# Patient Record
Sex: Male | Born: 1960 | Race: White | Hispanic: No | Marital: Single | Smoking: Never smoker
Health system: Southern US, Community
[De-identification: ages and names within clinical notes are randomized; demographics above are authoritative.]

## PROBLEM LIST (undated history)

## (undated) DIAGNOSIS — F419 Anxiety disorder, unspecified: Secondary | ICD-10-CM

## (undated) DIAGNOSIS — M549 Dorsalgia, unspecified: Secondary | ICD-10-CM

## (undated) DIAGNOSIS — M1990 Unspecified osteoarthritis, unspecified site: Secondary | ICD-10-CM

## (undated) DIAGNOSIS — F32A Depression, unspecified: Secondary | ICD-10-CM

## (undated) DIAGNOSIS — E785 Hyperlipidemia, unspecified: Secondary | ICD-10-CM

## (undated) DIAGNOSIS — K219 Gastro-esophageal reflux disease without esophagitis: Secondary | ICD-10-CM

## (undated) DIAGNOSIS — I10 Essential (primary) hypertension: Secondary | ICD-10-CM

## (undated) DIAGNOSIS — F329 Major depressive disorder, single episode, unspecified: Secondary | ICD-10-CM

## (undated) DIAGNOSIS — I2510 Atherosclerotic heart disease of native coronary artery without angina pectoris: Secondary | ICD-10-CM

## (undated) DIAGNOSIS — J449 Chronic obstructive pulmonary disease, unspecified: Secondary | ICD-10-CM

## (undated) HISTORY — DX: Hyperlipidemia, unspecified: E78.5

## (undated) HISTORY — PX: NO PAST SURGERIES: SHX2092

## (undated) HISTORY — DX: Depression, unspecified: F32.A

## (undated) HISTORY — PX: WISDOM TOOTH EXTRACTION: SHX21

## (undated) HISTORY — DX: Major depressive disorder, single episode, unspecified: F32.9

---

## 2010-08-17 ENCOUNTER — Emergency Department (HOSPITAL_COMMUNITY)
Admission: EM | Admit: 2010-08-17 | Discharge: 2010-08-17 | Payer: Self-pay | Source: Home / Self Care | Admitting: Emergency Medicine

## 2010-11-06 LAB — DIFFERENTIAL
Eosinophils Relative: 5 % (ref 0–5)
Lymphocytes Relative: 48 % — ABNORMAL HIGH (ref 12–46)
Neutro Abs: 3.2 10*3/uL (ref 1.7–7.7)
Neutrophils Relative %: 40 % — ABNORMAL LOW (ref 43–77)

## 2010-11-06 LAB — BASIC METABOLIC PANEL
CO2: 25 mEq/L (ref 19–32)
Calcium: 9.2 mg/dL (ref 8.4–10.5)
Creatinine, Ser: 0.95 mg/dL (ref 0.4–1.5)
GFR calc non Af Amer: >60 mL/min (ref >60)
Glucose, Bld: 120 mg/dL — ABNORMAL HIGH (ref 70–99)
Sodium: 139 mEq/L (ref 135–145)

## 2010-11-06 LAB — CBC
MCV: 86.2 fL (ref 78.0–100.0)
Platelets: 287 10*3/uL (ref 150–400)

## 2010-11-25 ENCOUNTER — Emergency Department (HOSPITAL_COMMUNITY): Payer: Medicaid Other

## 2010-11-25 ENCOUNTER — Emergency Department (HOSPITAL_COMMUNITY)
Admission: EM | Admit: 2010-11-25 | Discharge: 2010-11-26 | Disposition: A | Discharge status: Home / Self Care | Payer: Medicaid Other | Attending: Emergency Medicine | Admitting: Emergency Medicine

## 2010-11-25 DIAGNOSIS — I10 Essential (primary) hypertension: Secondary | ICD-10-CM | POA: Insufficient documentation

## 2010-11-25 DIAGNOSIS — W260XXA Contact with knife, initial encounter: Secondary | ICD-10-CM | POA: Insufficient documentation

## 2010-11-25 DIAGNOSIS — W261XXA Contact with sword or dagger, initial encounter: Secondary | ICD-10-CM | POA: Insufficient documentation

## 2010-11-25 DIAGNOSIS — S61209A Unspecified open wound of unspecified finger without damage to nail, initial encounter: Secondary | ICD-10-CM | POA: Insufficient documentation

## 2012-03-21 ENCOUNTER — Encounter (HOSPITAL_COMMUNITY): Payer: Self-pay | Admitting: Emergency Medicine

## 2012-03-21 ENCOUNTER — Emergency Department (HOSPITAL_COMMUNITY)
Admission: EM | Admit: 2012-03-21 | Discharge: 2012-03-22 | Disposition: A | Discharge status: Home / Self Care | Payer: Medicaid Other | Attending: Emergency Medicine | Admitting: Emergency Medicine

## 2012-03-21 DIAGNOSIS — I10 Essential (primary) hypertension: Secondary | ICD-10-CM | POA: Insufficient documentation

## 2012-03-21 DIAGNOSIS — Z8249 Family history of ischemic heart disease and other diseases of the circulatory system: Secondary | ICD-10-CM | POA: Insufficient documentation

## 2012-03-21 DIAGNOSIS — Z809 Family history of malignant neoplasm, unspecified: Secondary | ICD-10-CM | POA: Insufficient documentation

## 2012-03-21 DIAGNOSIS — R0789 Other chest pain: Secondary | ICD-10-CM | POA: Insufficient documentation

## 2012-03-21 DIAGNOSIS — I2510 Atherosclerotic heart disease of native coronary artery without angina pectoris: Secondary | ICD-10-CM | POA: Insufficient documentation

## 2012-03-21 DIAGNOSIS — Z833 Family history of diabetes mellitus: Secondary | ICD-10-CM | POA: Insufficient documentation

## 2012-03-21 HISTORY — DX: Essential (primary) hypertension: I10

## 2012-03-21 HISTORY — DX: Dorsalgia, unspecified: M54.9

## 2012-03-21 HISTORY — DX: Atherosclerotic heart disease of native coronary artery without angina pectoris: I25.10

## 2012-03-21 NOTE — ED Notes (Signed)
Pt states he was having flu like sxs that started a few days ago and his dr called in a zpack yesterday  Pt states he took the second dose today

## 2012-03-21 NOTE — ED Notes (Signed)
Pt states he has chest pain in his left chest that radiates up into his left shoulder  Pt states it started 3 days ago with pain in his right shoulder and neck  Pt states the pain is a squeezing pressure  Pt states he continues to have the pain in his shoulder and is also having some pain in his right lower leg as well   Pt denies any other sxs but states he has been having "weird sensations in my body" over the past couple of days

## 2012-03-22 ENCOUNTER — Emergency Department (HOSPITAL_COMMUNITY): Payer: Medicaid Other

## 2012-03-22 LAB — BASIC METABOLIC PANEL
CO2: 25 mEq/L (ref 19–32)
Chloride: 102 mEq/L (ref 96–112)
GFR calc Af Amer: >90 mL/min (ref >90)
GFR calc non Af Amer: >90 mL/min (ref >90)
Glucose, Bld: 102 mg/dL — ABNORMAL HIGH (ref 70–99)
Sodium: 138 mEq/L (ref 135–145)

## 2012-03-22 LAB — CBC WITH DIFFERENTIAL/PLATELET
Basophils Absolute: 0 10*3/uL (ref 0.0–0.1)
Eosinophils Relative: 6 % — ABNORMAL HIGH (ref 0–5)
Hemoglobin: 14.2 g/dL (ref 13.0–17.0)
Lymphs Abs: 3.1 10*3/uL (ref 0.7–4.0)
MCH: 29.2 pg (ref 26.0–34.0)
MCV: 84.4 fL (ref 78.0–100.0)
Platelets: 275 10*3/uL (ref 150–400)
RBC: 4.86 MIL/uL (ref 4.22–5.81)
RDW: 13.3 % (ref 11.5–15.5)

## 2012-03-22 NOTE — ED Notes (Signed)
NP at bedside.

## 2012-03-22 NOTE — ED Provider Notes (Signed)
History     CSN: [id number]  Arrival date & time 03/21/12  2239   First MD Initiated Contact with Patient 03/22/12 [phone]      Chief Complaint  Patient presents with  . Chest Pain    (Consider location/radiation/quality/duration/timing/severity/associated sxs/prior treatment) HPI Comments: Patient states, that for the past week or so.  He's been having and having intermittent episodes of chest pain, radiating to his arm.  He states he has a history of angina, and several years ago, took nitroglycerin, but didn't need it for several years, so his physician.  Has not renewed at.  He, states, that this pain has woken him up.  The last 2 nights.  It is associated with shortness of breath and nausea he also has periods of their complaints of myalgias in his lower leg.  The right side of his neck, radiating to his right jaw.  He, states that 3 days ago.  He called his primary care physician because he had "flulike symptoms" which included congestion, vomiting, and diarrhea, and was called in a Z-Pak.  He is taken 2 days worth of azithromycin.  The history is provided by the patient.    Past Medical History  Diagnosis Date  . Hypertension   . CAD (coronary artery disease)   . Back pain     History reviewed. No pertinent past surgical history.  Family History  Problem Relation Age of Onset  . Diabetes Other   . Cancer Other   . Hypertension Other     History  Substance Use Topics  . Smoking status: Never Smoker   . Smokeless tobacco: Not on file  . Alcohol Use: Yes     rare      Review of Systems  Constitutional: Negative for fever and chills.  Respiratory: Positive for shortness of breath.   Cardiovascular: Positive for chest pain. Negative for palpitations and leg swelling.  Gastrointestinal: Positive for nausea. Negative for vomiting.    Allergies  Review of patient's allergies indicates no known allergies.  Home Medications   Current Outpatient Rx  Name Route Sig  Dispense Refill  . AMLODIPINE BESYLATE 10 MG PO TABS Oral Take 10 mg by mouth daily.    . ATENOLOL 50 MG PO TABS Oral Take 50 mg by mouth 2 (two) times daily.    . AZITHROMYCIN 250 MG PO TABS Oral Take 250-500 mg by mouth daily. Zpack: Take 2 tablets the first day then take 1 tablet for 4 days. Stop date: 03/24/12    . CLORAZEPATE DIPOTASSIUM 3.75 MG PO TABS Oral Take 3.75 mg by mouth 2 (two) times daily.    Marland Kitchen LISINOPRIL 20 MG PO TABS Oral Take 20 mg by mouth daily.    Marland Kitchen SIMVASTATIN 20 MG PO TABS Oral Take 20 mg by mouth every evening.    Marland Kitchen TRAMADOL HCL 50 MG PO TABS Oral Take 50 mg by mouth 2 (two) times daily as needed.      BP 126/88  Pulse 60  Temp 97.9 F (36.6 C) (Oral)  Resp 12  SpO2 98%  Physical Exam  Constitutional: He is oriented to person, place, and time. He appears well-developed.  HENT:  Head: Normocephalic.  Eyes: Pupils are equal, round, and reactive to light.  Neck: Normal range of motion.  Cardiovascular: Normal rate and regular rhythm.   No murmur heard. Pulmonary/Chest: Effort normal. He exhibits no tenderness.  Abdominal: Soft.  Musculoskeletal: Normal range of motion.  Neurological: He is alert and oriented  to person, place, and time.  Skin: Skin is warm. No rash noted.    ED Course  Procedures (including critical care time)  Labs Reviewed  CBC WITH DIFFERENTIAL - Abnormal; Notable for the following:    Neutrophils Relative 37 (*)     Lymphocytes Relative 48 (*)     Eosinophils Relative 6 (*)     All other components within normal limits  BASIC METABOLIC PANEL - Abnormal; Notable for the following:    Glucose, Bld 102 (*)     All other components within normal limits  TROPONIN I   Dg Chest 2 View  03/22/2012  *RADIOLOGY REPORT*  Clinical Data: Chest pain, shortness of breath. Hypertension.  CHEST - 2 VIEW  Comparison: 08/17/2010  Findings: Right mediastinal prominence is favored to reflect mild aortic tortuosity.  Heart size within normal range.   No focal consolidation.  No pleural effusion or pneumothorax. Multilevel degenerative changes.  No acute osseous finding.  IMPRESSION: Right mediastinal fullness is likely secondary to mild aortic tortuosity and can be confirmed with a chest CT follow-up.  Original Report Authenticated By: Waneta Martins, M.D.     1. Chest pain, atypical    ED ECG REPORT   Date: 03/22/2012  EKG Time: 5:36 AM  Rate: 73  Rhythm: normal sinus rhythm,  there are no previous tracings available for comparison  Axis: normal  Intervals:none  ST&T Change: none  Narrative Interpretation: none             MDM   Suspicions been having chest pain, on and off for the past week, plus I will obtain one set of cardiac markers, EKG, chest x-ray, electrolytes, to evaluate his chest pain Patient has had no further episodes of chest pain.  While in the emergency department.  I reviewed his lab results EKG, and chest x-ray, with him, and we'll refer him to cardiology for an outpatient stress test and back to his primary care provider for continued medical treatment      Arman Filter, NP 03/22/12 [phone]

## 2012-03-22 NOTE — ED Provider Notes (Signed)
Medical screening examination/treatment/procedure(s) were performed by non-physician practitioner and as supervising physician I was immediately available for consultation/collaboration. Alithea Lapage, MD, FACEP   Dianelly Ferran L Chloe Bluett, MD 03/22/12 0546 

## 2012-03-22 NOTE — ED Notes (Signed)
Patient transported to X-ray 

## 2012-03-22 NOTE — ED Notes (Signed)
Pt reports taking 3 "baby" aspirin on the way to the ED.

## 2015-04-28 ENCOUNTER — Emergency Department (HOSPITAL_COMMUNITY)
Admission: EM | Admit: 2015-04-28 | Discharge: 2015-04-28 | Disposition: A | Discharge status: Home / Self Care | Payer: Medicaid Other | Attending: Emergency Medicine | Admitting: Emergency Medicine

## 2015-04-28 ENCOUNTER — Encounter (HOSPITAL_COMMUNITY): Payer: Self-pay | Admitting: Emergency Medicine

## 2015-04-28 ENCOUNTER — Emergency Department (HOSPITAL_COMMUNITY): Payer: Medicaid Other

## 2015-04-28 DIAGNOSIS — Z79899 Other long term (current) drug therapy: Secondary | ICD-10-CM | POA: Diagnosis not present

## 2015-04-28 DIAGNOSIS — N189 Chronic kidney disease, unspecified: Secondary | ICD-10-CM | POA: Insufficient documentation

## 2015-04-28 DIAGNOSIS — R079 Chest pain, unspecified: Secondary | ICD-10-CM

## 2015-04-28 DIAGNOSIS — I129 Hypertensive chronic kidney disease with stage 1 through stage 4 chronic kidney disease, or unspecified chronic kidney disease: Secondary | ICD-10-CM | POA: Insufficient documentation

## 2015-04-28 LAB — CBC
HCT: 43.2 % (ref 39.0–52.0)
Hemoglobin: 14.8 g/dL (ref 13.0–17.0)
MCH: 29.2 pg (ref 26.0–34.0)
MCHC: 34.3 g/dL (ref 30.0–36.0)
MCV: 85.2 fL (ref 78.0–100.0)
PLATELETS: 282 10*3/uL (ref 150–400)
RBC: 5.07 MIL/uL (ref 4.22–5.81)
RDW: 13.1 % (ref 11.5–15.5)
WBC: 4.9 10*3/uL (ref 4.0–10.5)

## 2015-04-28 LAB — COMPREHENSIVE METABOLIC PANEL
ALT: 17 U/L (ref 17–63)
AST: 20 U/L (ref 15–41)
Albumin: 4.4 g/dL (ref 3.5–5.0)
Alkaline Phosphatase: 61 U/L (ref 38–126)
Anion gap: 9 (ref 5–15)
BUN: 11 mg/dL (ref 6–20)
CHLORIDE: 107 mmol/L (ref 101–111)
CO2: 23 mmol/L (ref 22–32)
Calcium: 9.5 mg/dL (ref 8.9–10.3)
Creatinine, Ser: 1.05 mg/dL (ref 0.61–1.24)
Glucose, Bld: 106 mg/dL — ABNORMAL HIGH (ref 65–99)
POTASSIUM: 4 mmol/L (ref 3.5–5.1)
SODIUM: 139 mmol/L (ref 135–145)
Total Bilirubin: 0.5 mg/dL (ref 0.3–1.2)
Total Protein: 8.1 g/dL (ref 6.5–8.1)

## 2015-04-28 LAB — I-STAT TROPONIN, ED
TROPONIN I, POC: 0 ng/mL (ref 0.00–0.08)
TROPONIN I, POC: 0.01 ng/mL (ref 0.00–0.08)

## 2015-04-28 LAB — PROTIME-INR
INR: 0.95 (ref 0.00–1.49)
PROTHROMBIN TIME: 12.9 s (ref 11.6–15.2)

## 2015-04-28 LAB — APTT: APTT: 25 s (ref 24–37)

## 2015-04-28 MED ORDER — ASPIRIN 81 MG PO CHEW: 324.0000 mg | CHEWABLE_TABLET | Freq: Once | ORAL | Status: DC

## 2015-04-28 MED ORDER — SODIUM CHLORIDE 0.9 % IV SOLN
20.0000 mL | INTRAVENOUS | Status: DC
  Administered 2015-04-28: 20 mL via INTRAVENOUS

## 2015-04-28 MED ORDER — NITROGLYCERIN 0.4 MG SL SUBL: 0.4000 mg | SUBLINGUAL_TABLET | SUBLINGUAL | Status: DC | PRN

## 2015-04-28 NOTE — Discharge Instructions (Signed)

## 2015-04-28 NOTE — ED Provider Notes (Signed)
CSN: [id number]     Arrival date & time 04/28/15  1103 History   First MD Initiated Contact with Patient 04/28/15 1116     Chief Complaint  Patient presents with  . Chest Pain     (Consider location/radiation/quality/duration/timing/severity/associated sxs/prior Treatment) HPI 54 year old male comes in to complaining of intermittent sharp chest pain that began 2 weeks ago. He cannot identify any precipitating factors. It is momentary and resolve spontaneously. He had some pain this morning. It lasted longer than usual and came to the emergency department. Upon arrival here it resolves spontaneously. He did take aspirin this morning. Past Medical History  Diagnosis Date  . Hypertension   . CAD (coronary artery disease)   . Back pain    History reviewed. No pertinent past surgical history. Family History  Problem Relation Age of Onset  . Diabetes Other   . Cancer Other   . Hypertension Other    Social History  Substance Use Topics  . Smoking status: Never Smoker   . Smokeless tobacco: None  . Alcohol Use: Yes     Comment: rare    Review of Systems  All other systems reviewed and are negative.     Allergies  Review of patient's allergies indicates no known allergies.  Home Medications   Prior to Admission medications   Medication Sig Start Date End Date Taking? Authorizing Provider  amLODipine (NORVASC) 10 MG tablet Take 10 mg by mouth daily.   Yes Historical Provider, MD  atenolol (TENORMIN) 50 MG tablet Take 50 mg by mouth 2 (two) times daily.   Yes Historical Provider, MD  busPIRone (BUSPAR) 10 MG tablet Take 10 mg by mouth 2 (two) times daily. 04/01/15  Yes Historical Provider, MD  CIALIS 5 MG tablet Take 5 mg by mouth daily as needed for erectile dysfunction.  04/24/15  Yes Historical Provider, MD  HYDROcodone-acetaminophen (NORCO/VICODIN) 5-325 MG per tablet Take 1 tablet by mouth 2 (two) times daily as needed for moderate pain or severe pain.   Yes Historical  Provider, MD  lisinopril (PRINIVIL,ZESTRIL) 20 MG tablet Take 20 mg by mouth daily.   Yes Historical Provider, MD  LORazepam (ATIVAN) 1 MG tablet Take 1 mg by mouth 2 (two) times daily. 04/01/15  Yes Historical Provider, MD   BP 120/81 mmHg  Pulse 65  Temp(Src) 98.5 F (36.9 C) (Oral)  Resp 20  SpO2 100% Physical Exam  Constitutional: He is oriented to person, place, and time. He appears well-developed and well-nourished.  HENT:  Head: Normocephalic and atraumatic.  Right Ear: External ear normal.  Left Ear: External ear normal.  Nose: Nose normal.  Mouth/Throat: Oropharynx is clear and moist.  Eyes: Conjunctivae and EOM are normal. Pupils are equal, round, and reactive to light.  Neck: Normal range of motion. Neck supple.  Cardiovascular: Normal rate, regular rhythm, normal heart sounds and intact distal pulses.   Pulmonary/Chest: Effort normal and breath sounds normal. No respiratory distress. He has no wheezes. He exhibits no tenderness.  Abdominal: Soft. Bowel sounds are normal. He exhibits no distension and no mass. There is no tenderness. There is no guarding.  Musculoskeletal: Normal range of motion.  Neurological: He is alert and oriented to person, place, and time. He has normal reflexes. He exhibits normal muscle tone. Coordination normal.  Skin: Skin is warm and dry.  Psychiatric: He has a normal mood and affect. His behavior is normal. Judgment and thought content normal.  Nursing note and vitals reviewed.   ED Course  Procedures (including critical care time) Labs Review Labs Reviewed  COMPREHENSIVE METABOLIC PANEL - Abnormal; Notable for the following:    Glucose, Bld 106 (*)    All other components within normal limits  CBC  APTT  PROTIME-INR  I-STAT TROPOININ, ED  Rosezena Sensor, ED    Imaging Review Dg Chest 2 View  04/28/2015   CLINICAL DATA:  Mid left chest pain  EXAM: CHEST  2 VIEW  COMPARISON:  03/22/2012  FINDINGS: Normal heart size and mediastinal  contours. No acute infiltrate or edema. No effusion or pneumothorax. Prominent thoracic spondylosis. No acute osseous findings.  IMPRESSION: No active cardiopulmonary disease.   Electronically Signed   By: Marnee Spring M.D.   On: 04/28/2015 11:36   I have personally reviewed and evaluated these images and lab results as part of my medical decision-making.   EKG Interpretation   Date/Time:  Thursday April 28 2015 11:12:29 EDT Ventricular Rate:  70 PR Interval:  161 QRS Duration: 95 QT Interval:  389 QTC Calculation: 420 R Axis:   70 Text Interpretation:  Normal sinus rhythm Non-specific ST-t changes lead  II and avf Confirmed by Eliott Amparan MD, Lenisha Lacap ([id number]) on 04/28/2015 11:16:57 AM      MDM   Final diagnoses:  Chest pain, unspecified chest pain type    Patient remained pain-free here in emergency department. EKG shows no evidence of acute ischemic changes. Troponin is normal 2. Patient advised regarding need for close follow-up   Margarita Grizzle, MD 04/28/15 [phone]

## 2015-04-28 NOTE — ED Notes (Signed)
Per MD, hold nitro at this time d/t no chest pain.

## 2015-04-28 NOTE — ED Notes (Signed)
Spoke to MD about patient stating he could feel electrical currents in his chest.  Patient does not want pain medications.

## 2015-04-28 NOTE — ED Notes (Signed)
Patient with hx of angina c/o intermittent central chest pain radiating to arm and shoulder onset 1-2 weeks ago. Pt states that he came to ED because pain was not subsiding. Pt denies worsening of pain on exertion, states he believes pain is worse when sitting still but is not certain. Pt denies pain currently, states pain ended when he sat in exam chair. Pt endorses some confusion over the past year, states he takes a lot of medications.

## 2015-04-28 NOTE — ED Notes (Signed)
Patient transported to X-ray 

## 2015-04-28 NOTE — ED Notes (Signed)
Patient in radiology

## 2017-01-16 ENCOUNTER — Telehealth: Payer: Self-pay | Admitting: Endocrinology

## 2017-01-16 NOTE — Telephone Encounter (Signed)
[facility] rep gave the wrong info for the pt she was talking about

## 2017-03-18 ENCOUNTER — Emergency Department (HOSPITAL_COMMUNITY): Payer: Medicaid Other

## 2017-03-18 ENCOUNTER — Encounter (HOSPITAL_COMMUNITY): Payer: Self-pay | Admitting: Emergency Medicine

## 2017-03-18 ENCOUNTER — Emergency Department (HOSPITAL_COMMUNITY)
Admission: EM | Admit: 2017-03-18 | Discharge: 2017-03-18 | Disposition: A | Discharge status: Home / Self Care | Payer: Medicaid Other | Attending: Emergency Medicine | Admitting: Emergency Medicine

## 2017-03-18 DIAGNOSIS — R0602 Shortness of breath: Secondary | ICD-10-CM

## 2017-03-18 DIAGNOSIS — I10 Essential (primary) hypertension: Secondary | ICD-10-CM | POA: Insufficient documentation

## 2017-03-18 DIAGNOSIS — Z79899 Other long term (current) drug therapy: Secondary | ICD-10-CM | POA: Insufficient documentation

## 2017-03-18 DIAGNOSIS — R0789 Other chest pain: Secondary | ICD-10-CM | POA: Diagnosis present

## 2017-03-18 DIAGNOSIS — I2510 Atherosclerotic heart disease of native coronary artery without angina pectoris: Secondary | ICD-10-CM | POA: Diagnosis not present

## 2017-03-18 LAB — I-STAT TROPONIN, ED: TROPONIN I, POC: 0 ng/mL (ref 0.00–0.08)

## 2017-03-18 LAB — BASIC METABOLIC PANEL
Anion gap: 7 (ref 5–15)
BUN: 8 mg/dL (ref 6–20)
CALCIUM: 8.2 mg/dL — AB (ref 8.9–10.3)
CO2: 22 mmol/L (ref 22–32)
CREATININE: 1.05 mg/dL (ref 0.61–1.24)
Chloride: 106 mmol/L (ref 101–111)
GFR calc non Af Amer: >60 mL/min (ref >60)
Glucose, Bld: 103 mg/dL — ABNORMAL HIGH (ref 65–99)
Potassium: 6.1 mmol/L — ABNORMAL HIGH (ref 3.5–5.1)
SODIUM: 135 mmol/L (ref 135–145)

## 2017-03-18 LAB — CBC
HCT: 41.4 % (ref 39.0–52.0)
Hemoglobin: 14.4 g/dL (ref 13.0–17.0)
MCH: 29.3 pg (ref 26.0–34.0)
MCHC: 34.8 g/dL (ref 30.0–36.0)
MCV: 84.3 fL (ref 78.0–100.0)
Platelets: 262 10*3/uL (ref 150–400)
RBC: 4.91 MIL/uL (ref 4.22–5.81)
RDW: 13.3 % (ref 11.5–15.5)
WBC: 6.7 10*3/uL (ref 4.0–10.5)

## 2017-03-18 MED ORDER — ALBUTEROL SULFATE HFA 108 (90 BASE) MCG/ACT IN AERS
1.0000 | INHALATION_SPRAY | Freq: Four times a day (QID) | RESPIRATORY_TRACT | Status: DC | PRN
  Administered 2017-03-18: 2 via RESPIRATORY_TRACT
  Filled 2017-03-18: qty 6.7

## 2017-03-18 NOTE — ED Triage Notes (Signed)
Patient complaining of chest pain on and off today. Patient states that he is dealing with mold issues and has had trouble with his breathing. Patient states that it is making his head hurt.

## 2017-03-18 NOTE — ED Notes (Signed)
UNABLE TO COLLECT LABS AT THIS TIME PT CURRENTLY NOT IN ROOM

## 2017-03-18 NOTE — ED Provider Notes (Signed)
[facility]-EMERGENCY DEPT Provider Note   CSN: [id number] Arrival date & time: 03/18/17  0255     History   Chief Complaint Chief Complaint  Patient presents with  . Chest Pain    HPI Lawrence Fitzgerald is a 56 y.o. male with history of back pain, CAD, HTN who presents today with chief complaint gradual onset, progressively worsening episodes of chest pain and shortness of breath for 2 months. Patient states that he notes that he has had multiple episodes daily of chest pressure and tightness that do not radiate. Pain is sometimes dull and achy, at times feels like a sharp or sensation. Associated symptoms include shortness of breath, cough productive of green sputum, subjective fevers, sneezing. He has tried to limit physical activity in an attempt to see if his symptoms are musculoskeletal in nature. This has not been helpful. Has not tried anything else for his symptoms. His symptoms worsen when he is at home significantly. He states that he has had issues with mold in his apartment ventilation system for months. He denies recent travel or surgeries, hemoptysis, testosterone use, or prior history of DVT/PE.  The history is provided by the patient.    Past Medical History:  Diagnosis Date  . Back pain   . CAD (coronary artery disease)   . Hypertension     There are no active problems to display for this patient.   History reviewed. No pertinent surgical history.     Home Medications    Prior to Admission medications   Medication Sig Start Date End Date Taking? Authorizing Provider  amLODipine (NORVASC) 10 MG tablet Take 10 mg by mouth daily after breakfast.    Yes [provider]  atenolol (TENORMIN) 50 MG tablet Take 50 mg by mouth 2 (two) times daily.   Yes [provider]  busPIRone (BUSPAR) 10 MG tablet Take 10 mg by mouth 2 (two) times daily. 04/01/15  Yes [provider]  CIALIS 5 MG tablet Take 5 mg by mouth daily as needed for erectile dysfunction.   04/24/15  Yes [provider]  HYDROcodone-acetaminophen (NORCO/VICODIN) 5-325 MG per tablet Take 0.5-1 tablets by mouth 2 (two) times daily. 1 tablet every morning, 0.5 tablet every afternoon or evening   Yes [provider]  lisinopril (PRINIVIL,ZESTRIL) 20 MG tablet Take 20 mg by mouth daily after breakfast.    Yes [provider]  LORazepam (ATIVAN) 1 MG tablet Take 1 mg by mouth 2 (two) times daily. 04/01/15  Yes [provider]    Family History Family History  Problem Relation Age of Onset  . Diabetes Other   . Cancer Other   . Hypertension Other     Social History Social History  Substance Use Topics  . Smoking status: Never Smoker  . Smokeless tobacco: Never Used  . Alcohol use Yes     Comment: rare     Allergies   Patient has no known allergies.   Review of Systems Review of Systems  Constitutional: Positive for fever.  HENT: Positive for sneezing.   Respiratory: Positive for cough, chest tightness and shortness of breath.   Cardiovascular: Positive for chest pain.  Gastrointestinal: Negative for nausea and vomiting.     Physical Exam Updated Vital Signs BP 104/79 (BP Location: Left Arm)   Pulse 65   Temp 97.7 F (36.5 C) (Oral)   Resp 16   Ht 5\' 7"  (1.702 m)   Wt 90.7 kg (200 lb)   SpO2 100%  BMI 31.32 kg/m   Physical Exam  Constitutional: He appears well-developed and well-nourished. No distress.  HENT:  Head: Normocephalic and atraumatic.  Eyes: Conjunctivae are normal. Right eye exhibits no discharge. Left eye exhibits no discharge.  Neck: Normal range of motion. Neck supple. No JVD present. No tracheal deviation present.  Cardiovascular: Normal rate, regular rhythm, normal heart sounds and intact distal pulses.   2+ radial and DP/PT pulses bl, negative Homan's bl   Pulmonary/Chest: Effort normal and breath sounds normal. No respiratory distress. He has no wheezes. He has no rales. He exhibits no tenderness.    Abdominal: Soft. Bowel sounds are normal. He exhibits no distension. There is no tenderness.  Musculoskeletal: Normal range of motion. He exhibits no edema.  Neurological: He is alert.  Skin: Skin is warm and dry. No erythema.  Psychiatric: He has a normal mood and affect. His behavior is normal.  Nursing note and vitals reviewed.    ED Treatments / Results  Labs (all labs ordered are listed, but only abnormal results are displayed) Labs Reviewed  BASIC METABOLIC PANEL - Abnormal; Notable for the following:       Result Value   Potassium 6.1 (*)    Glucose, Bld 103 (*)    Calcium 8.2 (*)    All other components within normal limits  CBC  I-STAT TROPONIN, ED    EKG  EKG Interpretation  Date/Time:  Monday March 18 2017 03:07:17 EDT Ventricular Rate:  69 PR Interval:    QRS Duration: 102 QT Interval:  391 QTC Calculation: 419 R Axis:   73 Text Interpretation:  Sinus rhythm Normal ECG No significant change was found Confirmed by Paula Libra ([id number]) on 03/18/2017 3:14:08 AM Also confirmed by Paula Libra ([id number]), editor Jac Canavan, [place] [phone])  on 03/18/2017 6:52:59 AM       Radiology Dg Chest 2 View  Result Date: 03/18/2017 CLINICAL DATA:  Chest pain.  Pain for months, worse today. EXAM: CHEST  2 VIEW COMPARISON:  Radiographs 04/28/2015 FINDINGS: The cardiomediastinal contours are normal. The lungs are clear. Pulmonary vasculature is normal. No consolidation, pleural effusion, or pneumothorax. No acute osseous abnormalities are seen. Again seen degenerative change in the spine. IMPRESSION: No active cardiopulmonary disease. Electronically Signed   By: Rubye Oaks M.D.   On: 03/18/2017 03:36    Procedures Procedures (including critical care time)  Medications Ordered in ED Medications  albuterol (PROVENTIL HFA;VENTOLIN HFA) 108 (90 Base) MCG/ACT inhaler 1-2 puff (2 puffs Inhalation Given 03/18/17 2956)     Initial Impression / Assessment and Plan / ED Course   I have reviewed the triage vital signs and the nursing notes.  Pertinent labs & imaging results that were available during my care of the patient were reviewed by me and considered in my medical decision making (see chart for details).     Patient with complaint of ongoing chest pain and shortness of breath for 2 months. Thinks it may be related to mold in his ventilation system in his apartment. Afebrile, vital signs are stable. Asymptomatic while in the ED. EKG shows normal sinus rhythm, troponin negative. Chest x-ray shows no active cardiopulmonary disease. And no adventitious sounds heard on auscultation of the lungs. Low suspicion of ACS/MI given history and physical. Low suspicion of DVT or PE in the absence of risk factors. Do not suspect pericarditis, pneumonia, or bacterial bronchitis. Afebrile with no leukocytosis, low suspicion of an infectious source of his symptoms. He is hypokalemic at 6.1, which may  be a result of hemolysis but no EKG abnormalities. No further emergent workup required at this time. He is stable for discharge home with follow-up with his primary care in the next 2-3 days for reevaluation and repeat potassium level. Provided with albuterol inhaler for use when necessary when shortness of breath occurs while at home until then. Discussed indications for return to the ED. Pt verbalized understanding of and agreement with plan and is safe for discharge home at this time.  Final Clinical Impressions(s) / ED Diagnoses   Final diagnoses:  Atypical chest pain  Shortness of breath    New Prescriptions Discharge Medication List as of 03/18/2017  6:30 AM       Jeanie Sewer, PA-C 03/18/17 0917    Paula Libra, MD 03/20/17 2237

## 2017-03-18 NOTE — Discharge Instructions (Signed)
Take the albuterol inhaler as needed for shortness of breath. You may take ibuprofen or Tylenol as needed for pain. Follow-up with your primary care physician in the next 2-3 days for reevaluation and repeat potassium level. Return to the ED immediately if any concerning signs or symptoms develop.

## 2017-12-25 ENCOUNTER — Encounter (HOSPITAL_COMMUNITY): Payer: Self-pay | Admitting: Emergency Medicine

## 2017-12-25 ENCOUNTER — Emergency Department (HOSPITAL_COMMUNITY)
Admission: EM | Admit: 2017-12-25 | Discharge: 2017-12-26 | Disposition: A | Discharge status: Home / Self Care | Payer: Medicaid Other | Attending: Emergency Medicine | Admitting: Emergency Medicine

## 2017-12-25 ENCOUNTER — Emergency Department (HOSPITAL_COMMUNITY): Payer: Medicaid Other

## 2017-12-25 ENCOUNTER — Other Ambulatory Visit: Payer: Self-pay

## 2017-12-25 DIAGNOSIS — R0789 Other chest pain: Secondary | ICD-10-CM | POA: Diagnosis not present

## 2017-12-25 DIAGNOSIS — I10 Essential (primary) hypertension: Secondary | ICD-10-CM | POA: Insufficient documentation

## 2017-12-25 DIAGNOSIS — R079 Chest pain, unspecified: Secondary | ICD-10-CM | POA: Diagnosis present

## 2017-12-25 DIAGNOSIS — I2510 Atherosclerotic heart disease of native coronary artery without angina pectoris: Secondary | ICD-10-CM | POA: Insufficient documentation

## 2017-12-25 DIAGNOSIS — Z79899 Other long term (current) drug therapy: Secondary | ICD-10-CM | POA: Diagnosis not present

## 2017-12-25 LAB — CBC
HEMATOCRIT: 45.1 % (ref 39.0–52.0)
Hemoglobin: 15.3 g/dL (ref 13.0–17.0)
MCH: 29.1 pg (ref 26.0–34.0)
MCHC: 33.9 g/dL (ref 30.0–36.0)
MCV: 85.9 fL (ref 78.0–100.0)
Platelets: 309 10*3/uL (ref 150–400)
RBC: 5.25 MIL/uL (ref 4.22–5.81)
RDW: 13.4 % (ref 11.5–15.5)
WBC: 12.1 10*3/uL — AB (ref 4.0–10.5)

## 2017-12-25 LAB — BASIC METABOLIC PANEL
ANION GAP: 11 (ref 5–15)
BUN: 12 mg/dL (ref 6–20)
CHLORIDE: 103 mmol/L (ref 101–111)
CO2: 24 mmol/L (ref 22–32)
Calcium: 9.1 mg/dL (ref 8.9–10.3)
Creatinine, Ser: 1.13 mg/dL (ref 0.61–1.24)
GFR calc non Af Amer: >60 mL/min (ref >60)
Glucose, Bld: 117 mg/dL — ABNORMAL HIGH (ref 65–99)
POTASSIUM: 3.6 mmol/L (ref 3.5–5.1)
SODIUM: 138 mmol/L (ref 135–145)

## 2017-12-25 LAB — I-STAT TROPONIN, ED: Troponin i, poc: 0.01 ng/mL (ref 0.00–0.08)

## 2017-12-25 LAB — GROUP A STREP BY PCR: GROUP A STREP BY PCR: NOT DETECTED

## 2017-12-25 MED ORDER — DEXAMETHASONE 4 MG PO TABS
10.0000 mg | ORAL_TABLET | Freq: Once | ORAL | Status: AC
  Administered 2017-12-25: 23:00:00 10 mg via ORAL
  Filled 2017-12-25: qty 2

## 2017-12-25 MED ORDER — KETOROLAC TROMETHAMINE 60 MG/2ML IM SOLN
60.0000 mg | Freq: Once | INTRAMUSCULAR | Status: AC
  Administered 2017-12-25: 60 mg via INTRAMUSCULAR
  Filled 2017-12-25: qty 2

## 2017-12-25 NOTE — ED Triage Notes (Signed)
Pt reports having chest pain that started around 3pm today after suffering with mold and mildew exposure for the last year.

## 2017-12-25 NOTE — ED Provider Notes (Signed)
[facility]-EMERGENCY DEPT Provider Note   CSN: [id number] Arrival date & time: 12/25/17  2101     History   Chief Complaint Chief Complaint  Patient presents with  . Chest Pain    HPI Lawrence Fitzgerald is a 57 y.o. male.  57 year old male with a history of hypertension and coronary artery disease presents to the emergency department for evaluation of chest pain.  Patient reports a sharp chest pain, "like an electrical current", that began at 1500 today.  It lasted for less than a minute before spontaneously resolving and began when patient was turning over on his side.  He states that he has had similar chest pain in the past, but was concerned about the degree of discomfort it caused him.  He believes that his symptoms may be due to mold and mildew exposure over the past year.  He states that his ducting was cleaned 2 days ago and he has experienced associated congestion, throat, dysphasia since this occurred.  He has tried over-the-counter allergy medication which did help his symptoms some.  He has also been taking azithromycin 250 mg daily which he had leftover in his house.  Patient complaining of subjective fever, though he is afebrile today, with increased fatigue.  He had one recurrence of this chest pain episode while in the waiting room.  Reports being chest pain-free at this time.  No hemoptysis, recent surgeries, recent hospitalizations.  The history is provided by the patient. No language interpreter was used.  Chest Pain      Past Medical History:  Diagnosis Date  . Back pain   . CAD (coronary artery disease)   . Hypertension     There are no active problems to display for this patient.   History reviewed. No pertinent surgical history.      Home Medications    Prior to Admission medications   Medication Sig Start Date End Date Taking? Authorizing Provider  amLODipine (NORVASC) 10 MG tablet Take 10 mg by mouth daily after breakfast.    Yes  [provider]  atenolol (TENORMIN) 50 MG tablet Take 50 mg by mouth 2 (two) times daily.   Yes [provider]  busPIRone (BUSPAR) 10 MG tablet Take 10 mg by mouth 2 (two) times daily. 04/01/15  Yes [provider]  lisinopril (PRINIVIL,ZESTRIL) 20 MG tablet Take 20 mg by mouth daily after breakfast.    Yes [provider]  LORazepam (ATIVAN) 1 MG tablet Take 1 mg by mouth 2 (two) times daily. 04/01/15  Yes [provider]  CIALIS 5 MG tablet Take 5 mg by mouth daily as needed for erectile dysfunction.  04/24/15   [provider]  loratadine (CLARITIN) 10 MG tablet Take 1 tablet (10 mg total) by mouth daily. 12/26/17   Antony Madura, PA-C    Family History Family History  Problem Relation Age of Onset  . Diabetes Other   . Cancer Other   . Hypertension Other     Social History Social History   Tobacco Use  . Smoking status: Never Smoker  . Smokeless tobacco: Never Used  Substance Use Topics  . Alcohol use: Yes    Comment: rare  . Drug use: No     Allergies   Patient has no known allergies.   Review of Systems Review of Systems  Cardiovascular: Positive for chest pain.  Ten systems reviewed and are negative for acute change, except as noted in the HPI.    Physical  Exam Updated Vital Signs BP (!) 124/92   Pulse 81   Temp 99.4 F (37.4 C) (Oral)   Resp 16   Ht  (1.702 m)   Wt 90.7 kg (200 lb)   SpO2 93%   BMI 31.32 kg/m   Physical Exam  Constitutional: He is oriented to person, place, and time. He appears well-developed and well-nourished. No distress.  Nontoxic appearing and in NAD  HENT:  Head: Normocephalic and atraumatic.  Posterior oropharyngeal edema with mild erythema. Uvula midline. Tolerating secretions. No tripoding or stridor.  Eyes: Conjunctivae and EOM are normal. No scleral icterus.  Neck: Normal range of motion.  Cardiovascular: Normal rate, regular rhythm and intact distal pulses.    Pulmonary/Chest: Effort normal. No stridor. No respiratory distress. He has no wheezes. He has no rales.  Lungs CTAB  Musculoskeletal: Normal range of motion.  Neurological: He is alert and oriented to person, place, and time. He exhibits normal muscle tone. Coordination normal.  Skin: Skin is warm and dry. No rash noted. He is not diaphoretic. No erythema. No pallor.  Psychiatric: He has a normal mood and affect. His behavior is normal.  Nursing note and vitals reviewed.    ED Treatments / Results  Labs (all labs ordered are listed, but only abnormal results are displayed) Labs Reviewed  BASIC METABOLIC PANEL - Abnormal; Notable for the following components:      Result Value   Glucose, Bld 117 (*)    All other components within normal limits  CBC - Abnormal; Notable for the following components:   WBC 12.1 (*)    All other components within normal limits  GROUP A STREP BY PCR  I-STAT TROPONIN, ED    EKG ED ECG REPORT   Date: 12/26/2017  Rate: 100  Rhythm: sinus tachycardia  QRS Axis: normal  Intervals: normal  ST/T Wave abnormalities: normal  Conduction Disutrbances:baseline wander in lead V1  Narrative Interpretation: Sinus tachycardia; no STEMI  Old EKG Reviewed: none available  I have personally reviewed the EKG tracing and agree with the computerized printout as noted.   Radiology Dg Chest 2 View  Result Date: 12/25/2017 CLINICAL DATA:  Chest pain starting around 3 p.m. today. Mold and mil due exposure for a year. Nonsmoker. History of hypertension and coronary artery disease. EXAM: CHEST - 2 VIEW COMPARISON:  03/18/2017 FINDINGS: Normal heart size and pulmonary vascularity. No focal airspace disease or consolidation in the lungs. No blunting of costophrenic angles. No pneumothorax. Mediastinal contours appear intact. Calcification of the aorta. Degenerative changes in the spine. IMPRESSION: No active cardiopulmonary disease. Electronically Signed   By: Burman Nieves M.D.   On: 12/25/2017 21:47    Procedures Procedures (including critical care time)  Medications Ordered in ED Medications  ketorolac (TORADOL) injection 60 mg (60 mg Intramuscular Given 12/25/17 2238)  dexamethasone (DECADRON) tablet 10 mg (10 mg Oral Given 12/25/17 2237)     Initial Impression / Assessment and Plan / ED Course  I have reviewed the triage vital signs and the nursing notes.  Pertinent labs & imaging results that were available during my care of the patient were reviewed by me and considered in my medical decision making (see chart for details).     Patient presents to the emergency department for evaluation of chest pain.  Low suspicion for cardiac etiology given reassuring workup today.  EKG is nonischemic and troponin negative.  Patient has a heart score of 3 consistent with low risk of acute  coronary event.  Chest x-ray without evidence of mediastinal widening to suggest dissection.  No pneumothorax, pneumonia, pleural effusion.  Pulmonary embolus further considered; however, patient without tachypnea, dyspnea, hypoxia.  Patient has Well's score is 1.5 c/w unlikely diagnosis of PE.  Patient has been chest pain-free since arrival.  He believes his symptoms may be associated with exposure to mold.  Patient reports noticing symptomatic improvement when he is outside of his apartment.  Have discussed the use of Claritin for continued symptomatic management, though ultimately he will need to be removed from this environment to prevent symptom recurrence.  Low suspicion for emergent etiology.  Patient stable for outpatient primary care follow-up.  Return precautions discussed and provided. Patient discharged in stable condition with no unaddressed concerns.   Final Clinical Impressions(s) / ED Diagnoses   Final diagnoses:  Atypical chest pain    ED Discharge Orders        Ordered    loratadine (CLARITIN) 10 MG tablet  Daily     12/26/17 0006       Antony Madura,  PA-C 12/26/17 0053    Marily Memos, MD 12/29/17 1531

## 2017-12-26 MED ORDER — LORATADINE 10 MG PO TABS: 10.0000 mg | ORAL_TABLET | Freq: Every day | ORAL | 0 refills | Status: DC

## 2017-12-26 NOTE — Discharge Instructions (Addendum)
Your work-up in the emergency department today was reassuring.  Your strep screen is negative.  We recommend the use of daily Claritin.  Continue your prescribed medications.  Follow-up with your primary care doctor to ensure resolution of symptoms.

## 2017-12-27 ENCOUNTER — Encounter (HOSPITAL_COMMUNITY): Payer: Self-pay | Admitting: Emergency Medicine

## 2017-12-27 ENCOUNTER — Other Ambulatory Visit: Payer: Self-pay

## 2017-12-27 ENCOUNTER — Emergency Department (HOSPITAL_COMMUNITY)
Admission: EM | Admit: 2017-12-27 | Discharge: 2017-12-28 | Disposition: A | Discharge status: Home / Self Care | Payer: Medicaid Other | Attending: Emergency Medicine | Admitting: Emergency Medicine

## 2017-12-27 ENCOUNTER — Emergency Department (HOSPITAL_COMMUNITY): Payer: Medicaid Other

## 2017-12-27 DIAGNOSIS — I10 Essential (primary) hypertension: Secondary | ICD-10-CM | POA: Diagnosis not present

## 2017-12-27 DIAGNOSIS — Z79899 Other long term (current) drug therapy: Secondary | ICD-10-CM | POA: Diagnosis not present

## 2017-12-27 DIAGNOSIS — R0602 Shortness of breath: Secondary | ICD-10-CM | POA: Diagnosis not present

## 2017-12-27 DIAGNOSIS — I2510 Atherosclerotic heart disease of native coronary artery without angina pectoris: Secondary | ICD-10-CM | POA: Diagnosis not present

## 2017-12-27 DIAGNOSIS — J36 Peritonsillar abscess: Secondary | ICD-10-CM | POA: Insufficient documentation

## 2017-12-27 DIAGNOSIS — R079 Chest pain, unspecified: Secondary | ICD-10-CM | POA: Diagnosis present

## 2017-12-27 LAB — BASIC METABOLIC PANEL
Anion gap: 9 (ref 5–15)
BUN: 15 mg/dL (ref 6–20)
CO2: 26 mmol/L (ref 22–32)
Calcium: 8.9 mg/dL (ref 8.9–10.3)
Chloride: 106 mmol/L (ref 101–111)
Creatinine, Ser: 1.03 mg/dL (ref 0.61–1.24)
GFR calc Af Amer: >60 mL/min (ref >60)
GLUCOSE: 111 mg/dL — AB (ref 65–99)
POTASSIUM: 3.4 mmol/L — AB (ref 3.5–5.1)
Sodium: 141 mmol/L (ref 135–145)

## 2017-12-27 LAB — CBC
HEMATOCRIT: 43 % (ref 39.0–52.0)
Hemoglobin: 14.5 g/dL (ref 13.0–17.0)
MCH: 29.2 pg (ref 26.0–34.0)
MCHC: 33.7 g/dL (ref 30.0–36.0)
MCV: 86.7 fL (ref 78.0–100.0)
Platelets: 386 10*3/uL (ref 150–400)
RBC: 4.96 MIL/uL (ref 4.22–5.81)
RDW: 13.9 % (ref 11.5–15.5)
WBC: 13.4 10*3/uL — ABNORMAL HIGH (ref 4.0–10.5)

## 2017-12-27 LAB — I-STAT TROPONIN, ED: Troponin i, poc: 0 ng/mL (ref 0.00–0.08)

## 2017-12-27 MED ORDER — DIPHENHYDRAMINE HCL 50 MG/ML IJ SOLN
25.0000 mg | Freq: Once | INTRAMUSCULAR | Status: AC
  Administered 2017-12-28: 25 mg via INTRAVENOUS
  Filled 2017-12-27: qty 1

## 2017-12-27 MED ORDER — FAMOTIDINE IN NACL 20-0.9 MG/50ML-% IV SOLN
20.0000 mg | Freq: Once | INTRAVENOUS | Status: AC
  Administered 2017-12-28: 20 mg via INTRAVENOUS
  Filled 2017-12-27: qty 50

## 2017-12-27 MED ORDER — RACEPINEPHRINE HCL 2.25 % IN NEBU
0.5000 mL | INHALATION_SOLUTION | Freq: Once | RESPIRATORY_TRACT | Status: AC
  Administered 2017-12-28: 0.5 mL via RESPIRATORY_TRACT
  Filled 2017-12-27: qty 0.5

## 2017-12-27 MED ORDER — DEXAMETHASONE SODIUM PHOSPHATE 10 MG/ML IJ SOLN
10.0000 mg | Freq: Once | INTRAMUSCULAR | Status: AC
Start: 2017-12-28 — End: 2017-12-28
  Administered 2017-12-28: 10 mg via INTRAVENOUS
  Filled 2017-12-27: qty 1

## 2017-12-27 NOTE — ED Notes (Signed)
Blue and gold top drawn in triage. 

## 2017-12-27 NOTE — ED Triage Notes (Signed)
Patient complaining of left chest pain that started around 4 pm. Patient is having a lot of anxiety. He thinks he has trouble breathing. Patient is talking without difficulty. Patient states he has mold in his apartment and states he is hurting. Patient is very upset because he states this is day 5 and he was here a few days ago. Patient O2 SAT is 100% on room air.

## 2017-12-28 ENCOUNTER — Encounter (HOSPITAL_COMMUNITY): Payer: Self-pay | Admitting: Radiology

## 2017-12-28 ENCOUNTER — Emergency Department (HOSPITAL_COMMUNITY): Payer: Medicaid Other

## 2017-12-28 DIAGNOSIS — J36 Peritonsillar abscess: Secondary | ICD-10-CM | POA: Diagnosis not present

## 2017-12-28 MED ORDER — SODIUM CHLORIDE 0.9 % IJ SOLN
INTRAMUSCULAR | Status: AC
  Administered 2017-12-28: 03:00:00
  Filled 2017-12-28: qty 50

## 2017-12-28 MED ORDER — IOPAMIDOL (ISOVUE-300) INJECTION 61%
100.0000 mL | Freq: Once | INTRAVENOUS | Status: AC | PRN
  Administered 2017-12-28: 100 mL via INTRAVENOUS

## 2017-12-28 MED ORDER — HYDROCODONE-ACETAMINOPHEN 5-325 MG PO TABS: 1.0000 | ORAL_TABLET | ORAL | 0 refills | Status: DC | PRN

## 2017-12-28 MED ORDER — DEXAMETHASONE SODIUM PHOSPHATE 10 MG/ML IJ SOLN
10.0000 mg | Freq: Once | INTRAMUSCULAR | Status: AC
  Administered 2017-12-28: 10 mg via INTRAVENOUS
  Filled 2017-12-28: qty 1

## 2017-12-28 MED ORDER — IOPAMIDOL (ISOVUE-300) INJECTION 61%
INTRAVENOUS | Status: AC
  Administered 2017-12-28: 03:00:00
  Filled 2017-12-28: qty 100

## 2017-12-28 MED ORDER — CLINDAMYCIN HCL 300 MG PO CAPS: 300.0000 mg | ORAL_CAPSULE | Freq: Four times a day (QID) | ORAL | 0 refills | Status: DC

## 2017-12-28 MED ORDER — SODIUM CHLORIDE 0.9 % IV SOLN
3.0000 g | Freq: Once | INTRAVENOUS | Status: AC
  Administered 2017-12-28: 3 g via INTRAVENOUS
  Filled 2017-12-28: qty 3

## 2017-12-28 NOTE — ED Provider Notes (Signed)
[facility]-EMERGENCY DEPT Provider Note   CSN: [id number] Arrival date & time: 12/27/17  2201     History   Chief Complaint Chief Complaint  Patient presents with  . Chest Pain  . Shortness of Breath    HPI Lawrence Fitzgerald is a 57 y.o. male.  Chief complaint is difficulty breathing.  HPI: 57 year old male.  Seen and evaluated here 2 nights ago.  Diagnosed with possible allergic reaction to mold.  Noted to have some "swelling" in his pharynx.  States he has more difficulty breathing.  States he had difficulty swallowing today and was having to spit some of the secretions.  Does complain of pain but only with questioning.  States he had a fever of 101 earlier today.  Has not had fever for the last several days.  Is a long convoluted story of symptoms similar to this over the last year he relates to mold in his air conditioning unit his apartment.  He states that he is on limited income and unable to move or change his situation.  He has had intermittent rash this week.  Past Medical History:  Diagnosis Date  . Back pain   . CAD (coronary artery disease)   . Hypertension     There are no active problems to display for this patient.   History reviewed. No pertinent surgical history.      Home Medications    Prior to Admission medications   Medication Sig Start Date End Date Taking? Authorizing Provider  amLODipine (NORVASC) 10 MG tablet Take 10 mg by mouth daily after breakfast.    Yes [provider]  atenolol (TENORMIN) 50 MG tablet Take 50 mg by mouth 2 (two) times daily.   Yes [provider]  busPIRone (BUSPAR) 10 MG tablet Take 10 mg by mouth 2 (two) times daily. 04/01/15  Yes [provider]  CIALIS 5 MG tablet Take 5 mg by mouth daily as needed for erectile dysfunction.  04/24/15  Yes [provider]  lisinopril (PRINIVIL,ZESTRIL) 20 MG tablet Take 20 mg by mouth daily after breakfast.    Yes [provider]  loratadine (CLARITIN) 10 MG tablet Take 1 tablet (10 mg total) by mouth daily. 12/26/17  Yes Antony Madura, PA-C  LORazepam (ATIVAN) 1 MG tablet Take 1 mg by mouth 2 (two) times daily. 04/01/15  Yes [provider]    Family History Family History  Problem Relation Age of Onset  . Diabetes Other   . Cancer Other   . Hypertension Other     Social History Social History   Tobacco Use  . Smoking status: Never Smoker  . Smokeless tobacco: Never Used  Substance Use Topics  . Alcohol use: Yes    Comment: rare  . Drug use: No     Allergies   Mold extract [trichophyton]   Review of Systems Review of Systems  Constitutional: Negative for appetite change, chills, diaphoresis, fatigue and fever.  HENT: Positive for sore throat, trouble swallowing and voice change. Negative for mouth sores.        Throat pain and swelling.  Difficulty swallowing.  Feels short of breath.  Eyes: Negative for visual disturbance.  Respiratory: Positive for shortness of breath. Negative for cough, chest tightness and wheezing.   Cardiovascular: Negative for chest pain.  Gastrointestinal: Negative for abdominal distention, abdominal pain, diarrhea, nausea and vomiting.  Endocrine: Negative for polydipsia, polyphagia and polyuria.  Genitourinary: Negative for dysuria, frequency and hematuria.  Musculoskeletal:  Negative for gait problem.  Skin: Negative for color change, pallor and rash.  Neurological: Negative for dizziness, syncope, light-headedness and headaches.  Hematological: Does not bruise/bleed easily.  Psychiatric/Behavioral: Negative for behavioral problems and confusion.     Physical Exam Updated Vital Signs BP (!) 127/100 (BP Location: Left Arm)   Pulse 73   Temp 100 F (37.8 C) (Oral)   Resp 17   Ht  (1.702 m)   Wt 90.7 kg (200 lb)   SpO2 97%   BMI 31.32 kg/m   Physical Exam  Constitutional: He is oriented to person, place, and time. He appears  well-developed and well-nourished. No distress.  HENT:  Head: Normocephalic.  Posterior pharynx shows soft tissue swelling left tonsillar pillar.  He does deviated uvula left to right.  Tongue normal.  Floor the mouth soft and normal.  Some of the soft tissue swelling and edema does extend onto the left soft palate.  Right posterior pharynx appears normal.  He has slightly enlarged minimally tender anterior cervical adenopathy.  Eyes: Pupils are equal, round, and reactive to light. Conjunctivae are normal. No scleral icterus.  Neck: Normal range of motion. Neck supple. No thyromegaly present.  Cardiovascular: Normal rate and regular rhythm. Exam reveals no gallop and no friction rub.  No murmur heard. Pulmonary/Chest: Effort normal and breath sounds normal. No respiratory distress. He has no wheezes. He has no rales.  Abdominal: Soft. Bowel sounds are normal. He exhibits no distension. There is no tenderness. There is no rebound.  Musculoskeletal: Normal range of motion.  Neurological: He is alert and oriented to person, place, and time.  Skin: Skin is warm and dry. No rash noted.  Psychiatric: He has a normal mood and affect. His behavior is normal.     ED Treatments / Results  Labs (all labs ordered are listed, but only abnormal results are displayed) Labs Reviewed  BASIC METABOLIC PANEL - Abnormal; Notable for the following components:      Result Value   Potassium 3.4 (*)    Glucose, Bld 111 (*)    All other components within normal limits  CBC - Abnormal; Notable for the following components:   WBC 13.4 (*)    All other components within normal limits  I-STAT TROPONIN, ED    EKG EKG Interpretation  Date/Time:  Friday Dec 27 2017 22:29:37 EDT Ventricular Rate:  81 PR Interval:    QRS Duration: 100 QT Interval:  380 QTC Calculation: 442 R Axis:   84 Text Interpretation:  Sinus rhythm Confirmed by Rolland Porter ([id number]) on 12/27/2017 11:40:52 PM   Radiology Dg Chest 2  View  Result Date: 12/27/2017 CLINICAL DATA:  57 year old male with progressive throat swelling this week. Difficulty breathing and swallowing. EXAM: CHEST - 2 VIEW COMPARISON:  Chest radiographs 12/25/2017 and earlier. FINDINGS: Lung volumes are stable and within normal limits. Mediastinal contours remain normal. Visualized tracheal air column is within normal limits. No pneumothorax, pulmonary edema, pleural effusion or confluent pulmonary opacity. No acute osseous abnormality identified. Negative visible bowel gas pattern. IMPRESSION: Stable and negative.  No acute cardiopulmonary abnormality. Electronically Signed   By: Odessa Fleming M.D.   On: 12/27/2017 23:06   Ct Soft Tissue Neck W Contrast  Result Date: 12/28/2017 CLINICAL DATA:  57 y/o M; left-sided chest pain with difficulty breathing. EXAM: CT NECK WITH CONTRAST TECHNIQUE: Multidetector CT imaging of the neck was performed using the standard protocol following the bolus administration of intravenous contrast. CONTRAST:  ISOVUE-300  IOPAMIDOL (ISOVUE-300) INJECTION 61% COMPARISON:  None. FINDINGS: Pharynx and larynx: Left palatine tonsil abscess measuring 16 x 17 x 29 mm. Inflammation of the left-sided oral and hypopharyngeal mucosa. Edema within left-sided parapharyngeal, masticator, and retropharyngeal compartments. Salivary glands: No inflammation, mass, or stone. Thyroid: Normal. Lymph nodes: Left upper cervical lymphadenopathy, likely reactive. No lymph node necrosis. Vascular: Negative. Limited intracranial: Negative. Visualized orbits: Negative. Mastoids and visualized paranasal sinuses: Clear. Skeleton: Cervical spondylosis with multi level disc and facet degenerative changes greatest at the C5-6 level where uncovertebral and facet hypertrophy result in advanced foraminal stenosis. Upper chest: Negative. Other: None. IMPRESSION: Left palatine tonsil abscess measuring up to 29 mm. Extensive surrounding inflammation in left deep cervical  compartments and left sided mucosa of the oral and hypopharynx. Electronically Signed   By: Mitzi Hansen M.D.   On: 12/28/2017 02:33    Procedures Procedures (including critical care time)  Medications Ordered in ED Medications  Racepinephrine HCl 2.25 % nebulizer solution 0.5 mL (0.5 mLs Nebulization Given 12/28/17 0033)  dexamethasone (DECADRON) injection 10 mg (10 mg Intravenous Given 12/28/17 0025)  diphenhydrAMINE (BENADRYL) injection 25 mg (25 mg Intravenous Given 12/28/17 0026)  famotidine (PEPCID) IVPB 20 mg premix (0 mg Intravenous Stopped 12/28/17 0100)  iopamidol (ISOVUE-300) 61 % injection 100 mL (100 mLs Intravenous Contrast Given 12/28/17 0205)  iopamidol (ISOVUE-300) 61 % injection (  Contrast Given 12/28/17 0244)  sodium chloride 0.9 % injection (  Given 12/28/17 0244)  Ampicillin-Sulbactam (UNASYN) 3 g in sodium chloride 0.9 % 100 mL IVPB (0 g Intravenous Stopped 12/28/17 0421)  dexamethasone (DECADRON) injection 10 mg (10 mg Intravenous Given 12/28/17 0411)     Initial Impression / Assessment and Plan / ED Course  I have reviewed the triage vital signs and the nursing notes.  Pertinent labs & imaging results that were available during my care of the patient were reviewed by me and considered in my medical decision making (see chart for details).    Treated initially as though this may be allergic reaction.  Exam also quite consistent with possible peritonsillar abscess.  Given handheld neb of racemic epi.  Given Decadron 10 also given Unasyn 3 g and an additional 10 mg IV Decadron.  Given Pepcid 20 IV.  Benadryl 25 IV.  He felt subjectively some improvement.  His exam is unchanged.  CT scan shows left peritonsillar abscess.  I discussed the case with Dr.Teoh.  He is planning her early a.m. incision and drainage of PTA.  Final Clinical Impressions(s) / ED Diagnoses   Final diagnoses:  Peritonsillar abscess    ED Discharge Orders    None       Rolland Porter,  MD 12/28/17 [phone]

## 2017-12-28 NOTE — Consult Note (Signed)
Reason for Consult: Left peritonsillar abscess Referring Physician: Tanna Furry, MD  HPI:  Lawrence Fitzgerald is an 57 y.o. male who presented to the [facility] long emergency room complaining of increasing left-sided sore throat and swallowing difficulty.  The patient has been symptomatic for the past 5 days.  The severity of his sore throat has increased.  He has been having increasing difficulty swallowing his saliva.  He had a fever of 101 yesterday.  His CT scan in the emergency room showed a large left peritonsillar abscess.  He has no previous history of significant tonsillitis or peritonsillar abscess.  He denies any previous ENT surgery.  Past Medical History:  Diagnosis Date  . Back pain   . CAD (coronary artery disease)   . Hypertension     History reviewed. No pertinent surgical history.  Family History  Problem Relation Age of Onset  . Diabetes Other   . Cancer Other   . Hypertension Other     Social History:  reports that he has never smoked. He has never used smokeless tobacco. He reports that he drinks alcohol. He reports that he does not use drugs.  Allergies:  Allergies  Allergen Reactions  . Mold Extract [Trichophyton] Anaphylaxis    Prior to Admission medications   Medication Sig Start Date End Date Taking? Authorizing Provider  amLODipine (NORVASC) 10 MG tablet Take 10 mg by mouth daily after breakfast.    Yes [provider]  atenolol (TENORMIN) 50 MG tablet Take 50 mg by mouth 2 (two) times daily.   Yes [provider]  busPIRone (BUSPAR) 10 MG tablet Take 10 mg by mouth 2 (two) times daily. 04/01/15  Yes [provider]  CIALIS 5 MG tablet Take 5 mg by mouth daily as needed for erectile dysfunction.  04/24/15  Yes [provider]  lisinopril (PRINIVIL,ZESTRIL) 20 MG tablet Take 20 mg by mouth daily after breakfast.    Yes [provider]  loratadine (CLARITIN) 10 MG tablet Take 1 tablet (10 mg total) by mouth daily. 12/26/17   Yes Antonietta Breach, PA-C  LORazepam (ATIVAN) 1 MG tablet Take 1 mg by mouth 2 (two) times daily. 04/01/15  Yes [provider]    Results for orders placed or performed during the hospital encounter of 12/27/17 (from the past 48 hour(s))  Basic metabolic panel     Status: Abnormal   Collection Time: 12/27/17 10:45 PM  Result Value Ref Range   Sodium 141 135 - 145 mmol/L   Potassium 3.4 (L) 3.5 - 5.1 mmol/L   Chloride 106 101 - 111 mmol/L   CO2 26 22 - 32 mmol/L   Glucose, Bld 111 (H) 65 - 99 mg/dL   BUN 15 6 - 20 mg/dL   Creatinine, Ser 1.03 0.61 - 1.24 mg/dL   Calcium 8.9 8.9 - 10.3 mg/dL   GFR calc non Af Amer >60 >60 mL/min   GFR calc Af Amer >60 >60 mL/min    Comment: (NOTE) The eGFR has been calculated using the CKD EPI equation. This calculation has not been validated in all clinical situations. eGFR's persistently <60 mL/min signify possible Chronic Kidney Disease.    Anion gap 9 5 - 15    Comment: Performed at [facility], [place] [address], [place], [place] [address]  CBC     Status: Abnormal   Collection Time: 12/27/17 10:45 PM  Result Value Ref Range   WBC 13.4 (H) 4.0 - 10.5 K/uL   RBC 4.96  4.22 - 5.81 MIL/uL   Hemoglobin 14.5 13.0 - 17.0 g/dL   HCT 43.0 39.0 - 52.0 %   MCV 86.7 78.0 - 100.0 fL   MCH 29.2 26.0 - 34.0 pg   MCHC 33.7 30.0 - 36.0 g/dL   RDW 13.9 11.5 - 15.5 %   Platelets 386 150 - 400 K/uL    Comment: Performed at [facility], [place] [address], [place], [place] [address]  I-stat troponin, ED     Status: None   Collection Time: 12/27/17 10:52 PM  Result Value Ref Range   Troponin i, poc 0.00 0.00 - 0.08 ng/mL   Comment 3            Comment: Due to the release kinetics of cTnI, a negative result within the first hours of the onset of symptoms does not rule out myocardial infarction with certainty. If myocardial infarction is still suspected, repeat the test at appropriate intervals.     Dg Chest 2  View  Result Date: 12/27/2017 CLINICAL DATA:  57 year old male with progressive throat swelling this week. Difficulty breathing and swallowing. EXAM: CHEST - 2 VIEW COMPARISON:  Chest radiographs 12/25/2017 and earlier. FINDINGS: Lung volumes are stable and within normal limits. Mediastinal contours remain normal. Visualized tracheal air column is within normal limits. No pneumothorax, pulmonary edema, pleural effusion or confluent pulmonary opacity. No acute osseous abnormality identified. Negative visible bowel gas pattern. IMPRESSION: Stable and negative.  No acute cardiopulmonary abnormality. Electronically Signed   By: Genevie Ann M.D.   On: 12/27/2017 23:06   Ct Soft Tissue Neck W Contrast  Result Date: 12/28/2017 CLINICAL DATA:  57 y/o M; left-sided chest pain with difficulty breathing. EXAM: CT NECK WITH CONTRAST TECHNIQUE: Multidetector CT imaging of the neck was performed using the standard protocol following the bolus administration of intravenous contrast. CONTRAST:  133m ISOVUE-300 IOPAMIDOL (ISOVUE-300) INJECTION 61% COMPARISON:  None. FINDINGS: Pharynx and larynx: Left palatine tonsil abscess measuring 16 x 17 x 29 mm. Inflammation of the left-sided oral and hypopharyngeal mucosa. Edema within left-sided parapharyngeal, masticator, and retropharyngeal compartments. Salivary glands: No inflammation, mass, or stone. Thyroid: Normal. Lymph nodes: Left upper cervical lymphadenopathy, likely reactive. No lymph node necrosis. Vascular: Negative. Limited intracranial: Negative. Visualized orbits: Negative. Mastoids and visualized paranasal sinuses: Clear. Skeleton: Cervical spondylosis with multi level disc and facet degenerative changes greatest at the C5-6 level where uncovertebral and facet hypertrophy result in advanced foraminal stenosis. Upper chest: Negative. Other: None. IMPRESSION: Left palatine tonsil abscess measuring up to 29 mm. Extensive surrounding inflammation in left deep cervical  compartments and left sided mucosa of the oral and hypopharynx. Electronically Signed   By: LKristine GarbeM.D.   On: 12/28/2017 02:33   Review of Systems  Constitutional: Negative for appetite change, chills, diaphoresis, fatigue and fever.  HENT: Positive for sore throat, trouble swallowing and voice change. Negative for mouth sores.        Throat pain and swelling.  Difficulty swallowing.  Feels short of breath.  Eyes: Negative for visual disturbance.  Respiratory: Positive for shortness of breath. Negative for cough, chest tightness and wheezing.   Cardiovascular: Negative for chest pain.  Gastrointestinal: Negative for abdominal distention, abdominal pain, diarrhea, nausea and vomiting.  Endocrine: Negative for polydipsia, polyphagia and polyuria.  Genitourinary: Negative for dysuria, frequency and hematuria.  Musculoskeletal: Negative for gait problem.  Skin: Negative for color change, pallor and rash.  Neurological: Negative for dizziness, syncope, light-headedness and headaches.  Hematological: Does not  bruise/bleed easily.  Psychiatric/Behavioral: Negative for behavioral problems and confusion.   Blood pressure 128/89, pulse 73, temperature 100 F (37.8 C), temperature source Oral, resp. rate 14, height 5' 7"  (1.702 m), weight 90.7 kg (200 lb), SpO2 96 %. Physical Exam  Constitutional: He is oriented to person, place, and time. He appears well-developed and well-nourished. No distress.  Head: Normocephalic.  Ears: Normal auricles and external auditory canals. Nose: Normal septum, turbinates, and nasal mucosa. Mouth: Severe left peritonsillar edema. He does deviated uvula left to right.  Tongue normal.  Floor the mouth soft and normal. Right posterior pharynx appears normal.  Eyes: Pupils are equal, round, and reactive to light. Conjunctivae are normal. No scleral icterus.  Neck: Normal range of motion. Neck supple. No thyromegaly present.  Cardiovascular: Normal rate  and regular rhythm. Exam reveals no gallop and no friction rub.  Pulmonary/Chest: Effort normal and breath sounds normal. No respiratory distress. He has no wheezes. He has no rales.  Musculoskeletal: Normal range of motion.  Neurological: He is alert and oriented to person, place, and time.  Skin: Skin is warm and dry. No rash noted.  Psychiatric: He has a normal mood and affect. His behavior is normal.   Procedure: Incision and drainage of the left peritonsillar abscess.   Anesthesia: local anesthesia with 1% lidocaine with epinephrine.   Description: The patient is placed upright on the hospital bed.  After adequate local anesthesia is achieved, an 18-G spinal needle is used to make multiple passes over the left superior tonsillar pole.  Approximately 3 cc of purulent fluid was evacuated.  The abscess cavity was incised opened with the spinal needle tip.  The patient tolerated the procedure well.    Assessment/Plan: Left peritonsillar abscess.  The patient underwent incision and drainage of his left peritonsillar abscess without difficulty.  The patient will be discharged home on oral clindamycin 300 mg 4 times daily for 10 days.  He may follow-up in my office in 1 week if he is still symptomatic.  Jayana Kotula W Yahmir Sokolov 12/28/2017, 6:55 AM

## 2017-12-28 NOTE — Discharge Instructions (Addendum)
Gargle and spit warm salt water 3-4 times per day. Call Dr. Suszanne Conners as needed if not continuing to improve.

## 2018-03-14 ENCOUNTER — Encounter (HOSPITAL_COMMUNITY): Payer: Self-pay

## 2018-03-14 ENCOUNTER — Other Ambulatory Visit: Payer: Self-pay

## 2018-03-14 ENCOUNTER — Emergency Department (HOSPITAL_COMMUNITY): Payer: Medicaid Other

## 2018-03-14 ENCOUNTER — Emergency Department (HOSPITAL_COMMUNITY)
Admission: EM | Admit: 2018-03-14 | Discharge: 2018-03-14 | Disposition: A | Discharge status: Home / Self Care | Payer: Medicaid Other | Attending: Emergency Medicine | Admitting: Emergency Medicine

## 2018-03-14 DIAGNOSIS — Z79899 Other long term (current) drug therapy: Secondary | ICD-10-CM | POA: Insufficient documentation

## 2018-03-14 DIAGNOSIS — I2510 Atherosclerotic heart disease of native coronary artery without angina pectoris: Secondary | ICD-10-CM | POA: Diagnosis not present

## 2018-03-14 DIAGNOSIS — J039 Acute tonsillitis, unspecified: Secondary | ICD-10-CM | POA: Diagnosis not present

## 2018-03-14 DIAGNOSIS — I10 Essential (primary) hypertension: Secondary | ICD-10-CM | POA: Diagnosis not present

## 2018-03-14 DIAGNOSIS — J029 Acute pharyngitis, unspecified: Secondary | ICD-10-CM | POA: Diagnosis present

## 2018-03-14 LAB — CBC WITH DIFFERENTIAL/PLATELET
BASOS ABS: 0 10*3/uL (ref 0.0–0.1)
Basophils Relative: 0 %
EOS ABS: 0.1 10*3/uL (ref 0.0–0.7)
EOS PCT: 2 %
HCT: 44.4 % (ref 39.0–52.0)
Hemoglobin: 14.8 g/dL (ref 13.0–17.0)
LYMPHS PCT: 24 %
Lymphs Abs: 1.8 10*3/uL (ref 0.7–4.0)
MCH: 28.8 pg (ref 26.0–34.0)
MCHC: 33.3 g/dL (ref 30.0–36.0)
MCV: 86.5 fL (ref 78.0–100.0)
MONO ABS: 0.6 10*3/uL (ref 0.1–1.0)
MONOS PCT: 8 %
Neutro Abs: 4.9 10*3/uL (ref 1.7–7.7)
Neutrophils Relative %: 66 %
PLATELETS: 312 10*3/uL (ref 150–400)
RBC: 5.13 MIL/uL (ref 4.22–5.81)
RDW: 13.4 % (ref 11.5–15.5)
WBC: 7.4 10*3/uL (ref 4.0–10.5)

## 2018-03-14 LAB — BASIC METABOLIC PANEL
ANION GAP: 7 (ref 5–15)
BUN: 10 mg/dL (ref 6–20)
CALCIUM: 8.9 mg/dL (ref 8.9–10.3)
CO2: 29 mmol/L (ref 22–32)
Chloride: 105 mmol/L (ref 98–111)
Creatinine, Ser: 1.02 mg/dL (ref 0.61–1.24)
GFR calc Af Amer: >60 mL/min (ref >60)
GFR calc non Af Amer: >60 mL/min (ref >60)
GLUCOSE: 95 mg/dL (ref 70–99)
Potassium: 3.7 mmol/L (ref 3.5–5.1)
Sodium: 141 mmol/L (ref 135–145)

## 2018-03-14 MED ORDER — CLINDAMYCIN HCL 150 MG PO CAPS: 300.0000 mg | ORAL_CAPSULE | Freq: Three times a day (TID) | ORAL | 0 refills | Status: AC

## 2018-03-14 MED ORDER — SODIUM CHLORIDE 0.9 % IV BOLUS
1000.0000 mL | Freq: Once | INTRAVENOUS | Status: AC
  Administered 2018-03-14: 1000 mL via INTRAVENOUS

## 2018-03-14 MED ORDER — CLINDAMYCIN PHOSPHATE 600 MG/50ML IV SOLN
600.0000 mg | Freq: Once | INTRAVENOUS | Status: AC
  Administered 2018-03-14: 600 mg via INTRAVENOUS
  Filled 2018-03-14: qty 50

## 2018-03-14 MED ORDER — KETOROLAC TROMETHAMINE 30 MG/ML IJ SOLN
15.0000 mg | Freq: Once | INTRAMUSCULAR | Status: AC
  Administered 2018-03-14: 15 mg via INTRAVENOUS
  Filled 2018-03-14: qty 1

## 2018-03-14 MED ORDER — DEXAMETHASONE SODIUM PHOSPHATE 10 MG/ML IJ SOLN
20.0000 mg | Freq: Once | INTRAMUSCULAR | Status: AC
  Administered 2018-03-14: 20 mg via INTRAVENOUS
  Filled 2018-03-14: qty 2

## 2018-03-14 NOTE — ED Provider Notes (Signed)
[facility]-EMERGENCY DEPT Provider Note   CSN: [id number] Arrival date & time: 03/14/18  1136     History   Chief Complaint Chief Complaint  Patient presents with  . Allergic Reaction    HPI Lawrence Fitzgerald is a 57 y.o. male.  HPI  Patient presents with concern of throat fullness, fever.  Patient has a history of peritonsillar abscess 3 months ago, notes that he was doing generally well, but over the past 48 hours he has had fullness of his throat, fever yesterday, difficulty swallowing. Some improvement with OTC medication, and some improvement in the pain with previously provided narcotics. Patient was previously seen by ENT, emergency department personnel, prior to treatment for that most recent abscess. Patient himself voices concern of possible mold exposure, but has no ongoing chest pain, no ongoing dyspnea, no other systemic complaints per However, he notes that he has been fighting about a with his home owner for years regarding appropriate HVAC maintenance.  Past Medical History:  Diagnosis Date  . Back pain   . CAD (coronary artery disease)   . Hypertension     There are no active problems to display for this patient.   History reviewed. No pertinent surgical history.      Home Medications    Prior to Admission medications   Medication Sig Start Date End Date Taking? Authorizing Provider  amLODipine (NORVASC) 10 MG tablet Take 10 mg by mouth daily after breakfast.    Yes [provider]  atenolol (TENORMIN) 50 MG tablet Take 50 mg by mouth 2 (two) times daily.   Yes [provider]  busPIRone (BUSPAR) 10 MG tablet Take 10 mg by mouth 2 (two) times daily. 04/01/15  Yes [provider]  HYDROcodone-acetaminophen (NORCO/VICODIN) 5-325 MG tablet Take 1 tablet by mouth every 4 (four) hours as needed. Patient taking differently: Take 0.5 tablets by mouth every 4 (four) hours as needed for moderate pain.  12/28/17  Yes  Rolland Porter, MD  lisinopril (PRINIVIL,ZESTRIL) 20 MG tablet Take 20 mg by mouth daily after breakfast.    Yes [provider]  loratadine (CLARITIN) 10 MG tablet Take 1 tablet (10 mg total) by mouth daily. 12/26/17  Yes Antony Madura, PA-C  LORazepam (ATIVAN) 1 MG tablet Take 1 mg by mouth 2 (two) times daily. 04/01/15  Yes [provider]  clindamycin (CLEOCIN) 300 MG capsule Take 1 capsule (300 mg total) by mouth 4 (four) times daily. Patient not taking: Reported on 03/14/2018 12/28/17   Rolland Porter, MD  sildenafil (REVATIO) 20 MG tablet Take 40 mg by mouth daily as needed. 02/09/18   [provider]  sodium fluoride (DENTA 5000 PLUS) 1.1 % CREA dental cream Take 1 application by mouth 2 (two) times daily.    [provider]    Family History Family History  Problem Relation Age of Onset  . Diabetes Other   . Cancer Other   . Hypertension Other     Social History Social History   Tobacco Use  . Smoking status: Never Smoker  . Smokeless tobacco: Never Used  Substance Use Topics  . Alcohol use: Yes    Comment: rare  . Drug use: No     Allergies   Mold extract [trichophyton]   Review of Systems Review of Systems  Constitutional:       Per HPI, otherwise negative  HENT:       Per HPI, otherwise negative  Respiratory:  Per HPI, otherwise negative  Cardiovascular:       Per HPI, otherwise negative  Gastrointestinal: Negative for vomiting.  Endocrine:       Negative aside from HPI  Genitourinary:       Neg aside from HPI   Musculoskeletal:       Per HPI, otherwise negative  Skin: Negative.   Neurological: Negative for syncope.     Physical Exam Updated Vital Signs BP 116/87   Pulse 67   Temp 98.8 F (37.1 C) (Oral)   Resp 16   Ht 5\' 8"  (1.727 m)   Wt 90.7 kg (200 lb)   SpO2 97%   BMI 30.41 kg/m   Physical Exam  Constitutional: He is oriented to person, place, and time. He appears well-developed. No distress.  HENT:    Head: Normocephalic and atraumatic.  Mouth/Throat: Oral lesions present. Posterior oropharyngeal edema present.    Eyes: Conjunctivae and EOM are normal.  Cardiovascular: Normal rate and regular rhythm.  Pulmonary/Chest: Effort normal. No stridor. No respiratory distress.  Abdominal: He exhibits no distension.  Musculoskeletal: He exhibits no edema.  Neurological: He is alert and oriented to person, place, and time.  Skin: Skin is warm and dry.  Psychiatric: He has a normal mood and affect.  Nursing note and vitals reviewed.    ED Treatments / Results  Labs (all labs ordered are listed, but only abnormal results are displayed) Labs Reviewed  BASIC METABOLIC PANEL  CBC WITH DIFFERENTIAL/PLATELET    EKG None  Radiology Ct Soft Tissue Neck Wo Contrast  Result Date: 03/14/2018 CLINICAL DATA:  Throat pain.  Afebrile. EXAM: CT NECK WITHOUT CONTRAST TECHNIQUE: Multidetector CT imaging of the neck was performed following the standard protocol without intravenous contrast. COMPARISON:  12/28/2017 FINDINGS: Pharynx and larynx: The study shows asymmetric enlargement of the left tonsil, measuring up to almost 3 cm in size. This is a noncontrast exam. I do not see any liquified portion. The differential diagnosis is inflammatory enlargement versus tonsillar mass. No other mucosal or submucosal lesion is seen. Salivary glands: Submandibular and parotid glands are normal. Thyroid: Normal Lymph nodes: Asymmetric enlarged level 2 node on the left with short axis dimension of 13 mm. Adjacent level 2 nodes metric with the other side with short axis diameter of 8 mm. There slightly asymmetric level 3 nodes on the left as well. This pattern is nonspecific and could be reactive to tonsillitis or indicate metastatic involvement. Vascular: Normal Limited intracranial: Normal Visualized orbits: Normal Mastoids and visualized paranasal sinuses: Clear Skeleton: Ordinary degenerative spondylosis. Upper chest:  Negative Other: Normal IMPRESSION: Persistent or recurrent left tonsillar enlargement. This is a noncontrast scan, but there is no evidence of definite low-density component. The area measures about 3 cm. This could either be inflammatory enlargement or could be a left tonsillar mass. Asymmetric lymph nodes on the left could either be reactive to chronic tonsillitis or could indicate metastatic disease from tonsillar carcinoma. Electronically Signed   By: Paulina FusiMark  Shogry M.D.   On: 03/14/2018 14:22    Procedures Procedures (including critical care time)  Medications Ordered in ED Medications  clindamycin (CLEOCIN) IVPB 600 mg (has no administration in time range)  dexamethasone (DECADRON) injection 20 mg (has no administration in time range)  ketorolac (TORADOL) 30 MG/ML injection 15 mg (15 mg Intravenous Given 03/14/18 1321)  sodium chloride 0.9 % bolus 1,000 mL (1,000 mLs Intravenous New Bag/Given 03/14/18 1321)     Initial Impression / Assessment and Plan / ED  Course  I have reviewed the triage vital signs and the nursing notes.  Pertinent labs & imaging results that were available during my care of the patient were reviewed by me and considered in my medical decision making (see chart for details).     3:32 PM On repeat exam patient continues to speak clearly, without any difficulties with phonation, without any muffled voice. No hypoxia, no tachypnea, tachycardia. We discussed CT findings, which are reviewed, and then discussed with the ENT physician who drained the peritonsillar abscess on the ipsilateral side 2 months ago. After discussion with my colleague, the patient will start clindamycin, Decadron, be discharged to follow-up in the office. No evidence for other airway compromise, bacteremia, sepsis, and though the patient complains of possible mold exposure, no evidence for compromise respiratory status related to this.   Final Clinical Impressions(s) / ED Diagnoses   Tonsillitis   Gerhard Munch, MD 03/14/18 1534

## 2018-03-14 NOTE — Discharge Instructions (Signed)
As discussed, today's evaluation is demonstrated enlargement in the area which you previously had drained on the left tonsils. Is important to take all medication as directed and schedule follow-up with our ENT colleague, Dr. Suszanne Connerseoh.  Return here for concerning changes in her condition.

## 2018-03-14 NOTE — ED Triage Notes (Addendum)
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 Patient states he had an anaphylactic reaction in May.  Patient states his HVAC system was serviced in May without proper precautions. patient states the HVAC system was serviced yesterday and he washed the sheer curtain that he had around the HVAC.  Today the patient states he has hives, and states he is unable to swallow his spit. Patient very talkative in triage and no problems with swallowing or talking. Patient states he has been living with this problems x 3 years.  Patient also has a water bottle with him that is 3/4 empty.  Patient also reports that he tested + for marijuana, but knows it is from the other tenants who smoke in the building and he knows that is why he was +.

## 2018-07-16 ENCOUNTER — Encounter: Payer: Self-pay | Admitting: Gastroenterology

## 2018-08-11 ENCOUNTER — Other Ambulatory Visit: Payer: Self-pay

## 2018-08-14 ENCOUNTER — Ambulatory Visit: Payer: Medicaid Other | Admitting: Gastroenterology

## 2019-01-13 IMAGING — CT CT NECK W/O CM
4 of 5 series · 16 of 33 positions shown, 18 images · non-contrast
Comparison: 12/28/2017

CLINICAL DATA: Throat pain.  Afebrile.

EXAM:
CT NECK WITHOUT CONTRAST
TECHNIQUE: Multidetector CT imaging of the neck was performed following the
standard protocol without intravenous contrast.

[Series 2: axial neck · axial · 0.53mm/px · z∈[+1556,+1712]mm · 4 of 131 slices shown]
[im 27/131  bone]
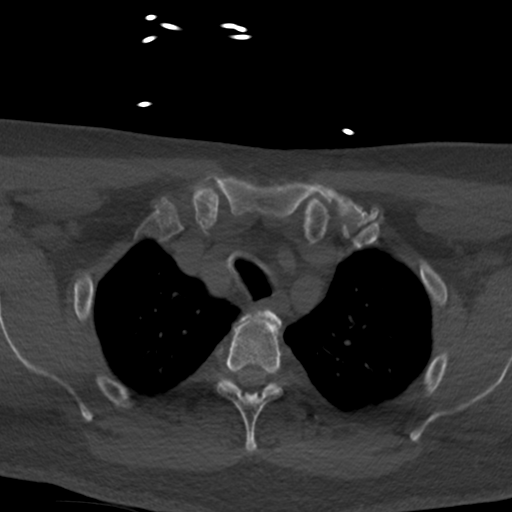
[im 53/131  bone]
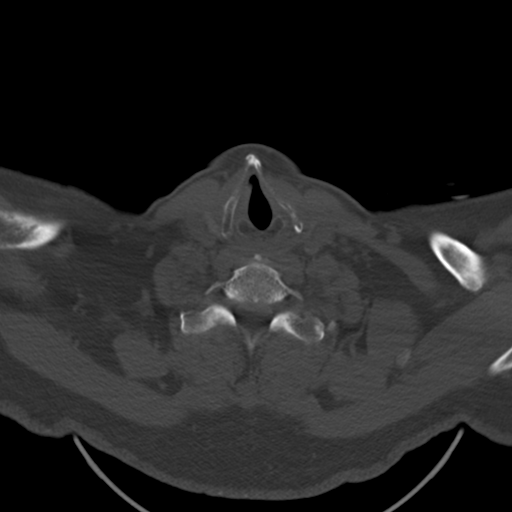
[im 79/131  bone]
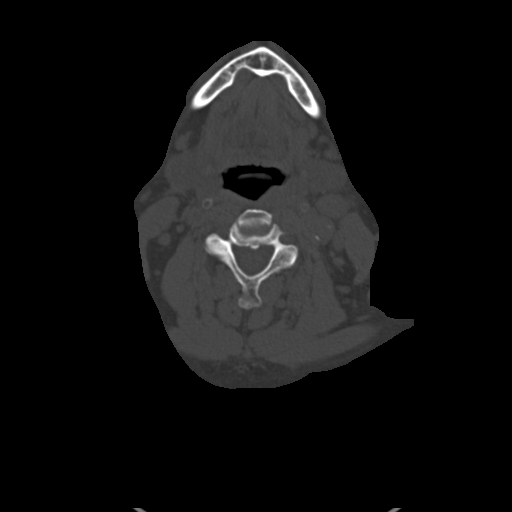
[im 105/131  bone]
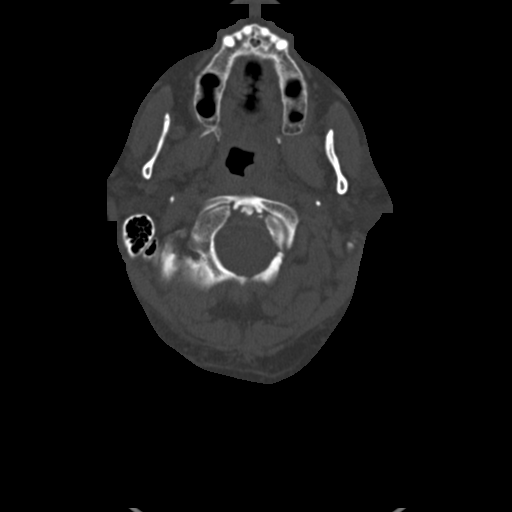

[Series 4: orthogonal ax · axial · 0.42mm/px · z∈[+1502,+1671]mm · 4 of 150 slices shown, 5 images]
[im 30/150  soft-tissue]
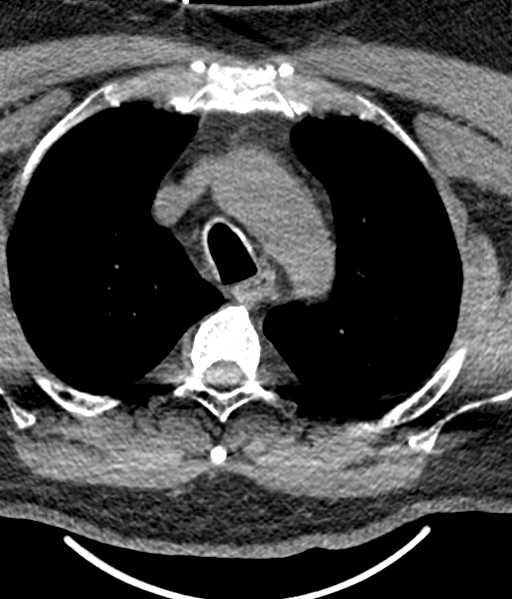
[im 30/150  bone]
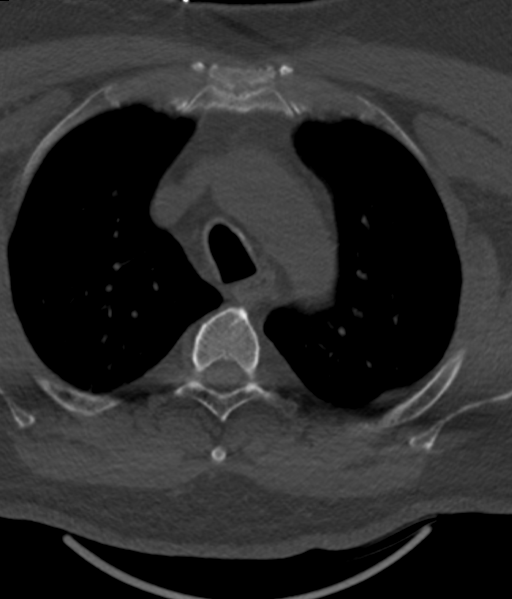
[im 60/150  bone]
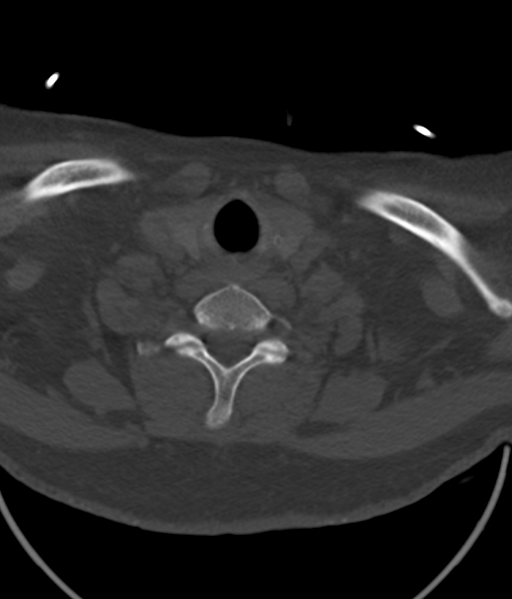
[im 90/150  bone]
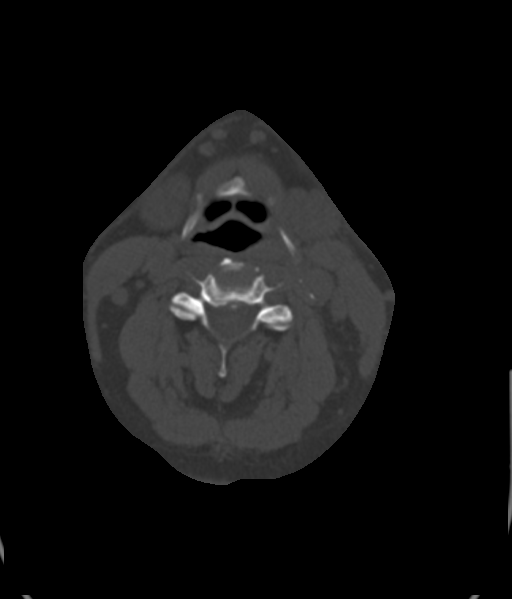
[im 120/150  bone]
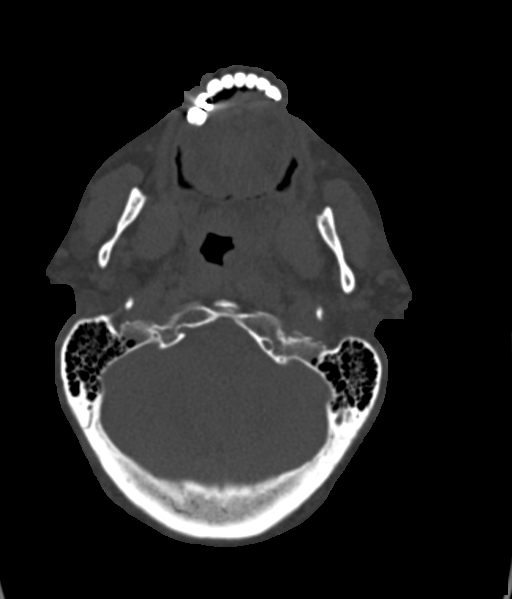

[Series 5: cor neck · coronal · 0.58mm/px · 3 of 113 slices shown]
[im 33/113  bone]
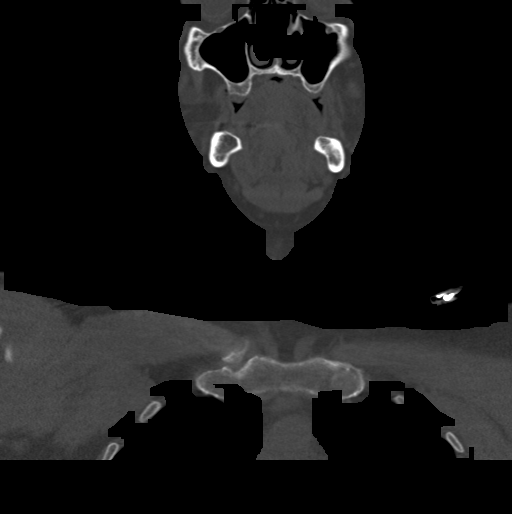
[im 49/113  bone]
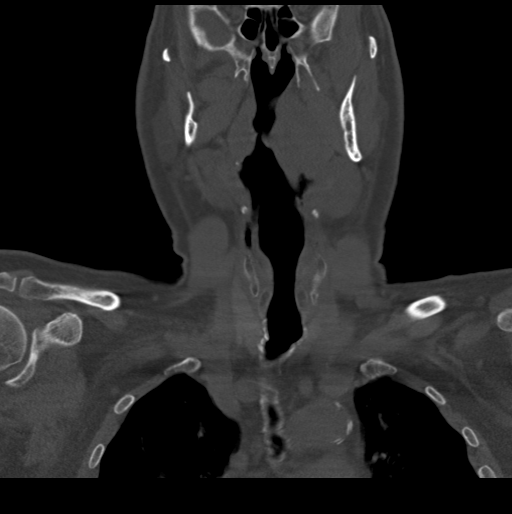
[im 64/113  bone]
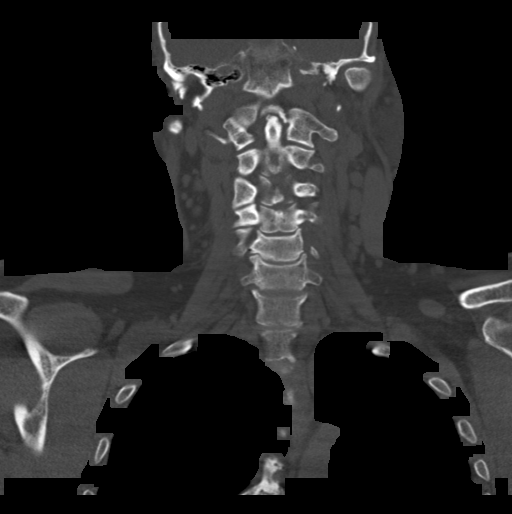

[Series 6: sag neck · sagittal · 0.57mm/px · 5 of 101 slices shown, 6 images]
[im 34/101  bone]
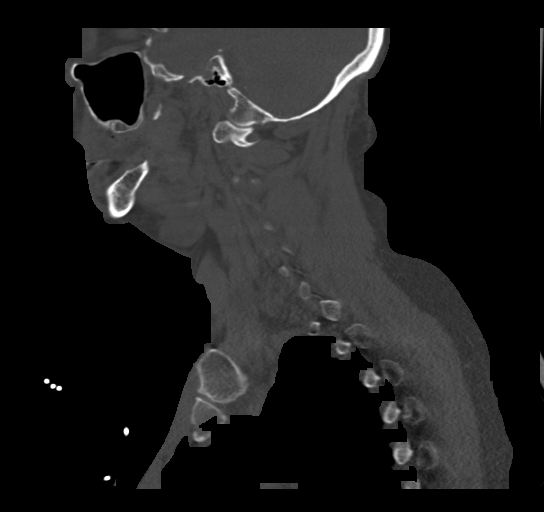
[im 42/101  bone]
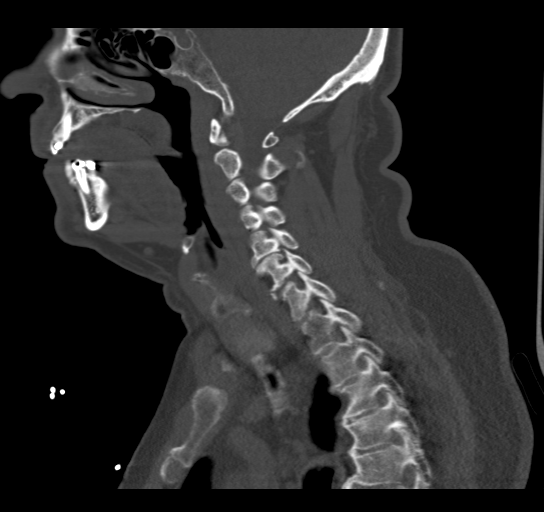
[im 51/101  soft-tissue]
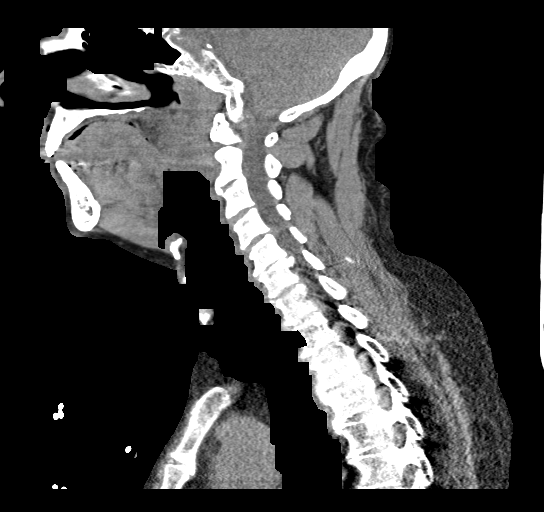
[im 51/101  bone]
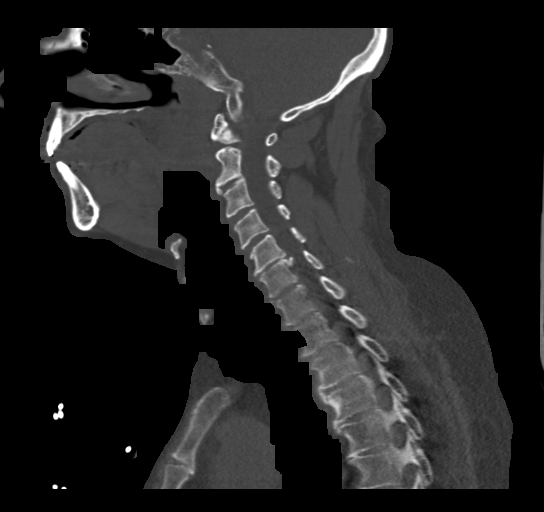
[im 59/101  bone]
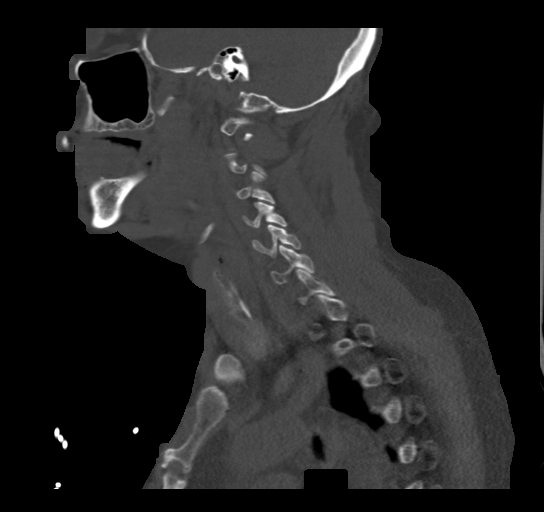
[im 67/101  bone]
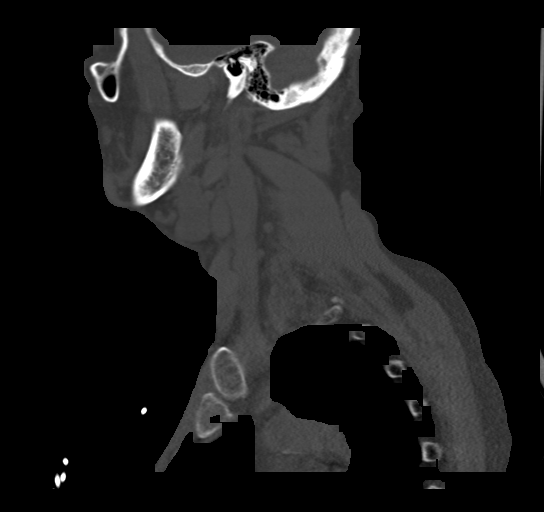

[16 of 33 positions shown; findings below may reference images not displayed]

FINDINGS: Pharynx and larynx: The study shows asymmetric enlargement of the
left tonsil, measuring up to almost 3 cm in size. This is a
noncontrast exam. I do not see any liquified portion. The
differential diagnosis is inflammatory enlargement versus tonsillar
mass. No other mucosal or submucosal lesion is seen.

Salivary glands: Submandibular and parotid glands are normal.

Thyroid: Normal

Lymph nodes: Asymmetric enlarged level 2 node on the left with short
axis dimension of 13 mm. Adjacent level 2 nodes metric with the
other side with short axis diameter of 8 mm. There slightly
asymmetric level 3 nodes on the left as well. This pattern is
nonspecific and could be reactive to tonsillitis or indicate
metastatic involvement.

Vascular: Normal

Limited intracranial: Normal

Visualized orbits: Normal

Mastoids and visualized paranasal sinuses: Clear

Skeleton: Ordinary degenerative spondylosis.

Upper chest: Negative

Other: Normal
IMPRESSION: Persistent or recurrent left tonsillar enlargement. This is a
noncontrast scan, but there is no evidence of definite low-density
component. The area measures about 3 cm. This could either be
inflammatory enlargement or could be a left tonsillar mass.
Asymmetric lymph nodes on the left could either be reactive to
chronic tonsillitis or could indicate metastatic disease from
tonsillar carcinoma.

## 2023-09-05 ENCOUNTER — Ambulatory Visit: Payer: Self-pay | Admitting: General Surgery

## 2023-09-11 NOTE — Progress Notes (Addendum)
COVID Vaccine received:  []  No [x]  Yes Date of any COVID positive Test in last 90 days:  none  PCP - Cammie Fulp, MD at [facility]  Cardiologist -  Dr. Yetta Barre at [facility]   Chest x-ray - 12-27-2017  2v  Epic EKG -  12-29-2017   will repeat at PST 09-12-2023 Stress Test -  ECHO -  Cardiac Cath -   PCR screen: []  Ordered & Completed []   No Order but Needs PROFEND     [x]   N/A for this surgery  Surgery Plan:  [x]  Ambulatory   []  Outpatient in bed  []  Admit Anesthesia:    [x]  General  []  Spinal  []   Choice []   MAC  Bowel Prep - [x]  No  []   Yes ______  Pacemaker / ICD device [x]  No []  Yes   Spinal Cord Stimulator:[x]  No []  Yes       History of Sleep Apnea? [x]  No []  Yes   CPAP used?- [x]  No []  Yes    Does the patient monitor blood sugar?   [x]  N/A   []  No []  Yes  Patient has: [x]  NO Hx DM   []  Pre-DM   []  DM1  []   DM2  Blood Thinner / Instructions:  none Aspirin Instructions:   none  ERAS Protocol Ordered: []  No  [x]  Yes  had both ERAS and NPO orders.  Patient is to be NPO after: midnight prior  Dental hx: []  Dentures:  [x]  N/A      []  Bridge or Partial:                   []  Loose or Damaged teeth:   Activity level: Patient is able to climb a flight of stairs without difficulty; [x]  No CP  [x]  No SOB.  Patient can perform ADLs without assistance.   Anesthesia review: HTN, anxiety, COPD, GERD,   Patient denies shortness of breath, fever, cough and chest pain at PAT appointment.  Patient verbalized understanding and agreement to the Pre-Surgical Instructions that were given to them at this PAT appointment. Patient was also educated of the need to review these PAT instructions again prior to his surgery.I reviewed the appropriate phone numbers to call if they have any and questions or concerns.

## 2023-09-12 ENCOUNTER — Encounter (HOSPITAL_COMMUNITY)
Admission: RE | Admit: 2023-09-12 | Discharge: 2023-09-12 | Disposition: A | Discharge status: Home / Self Care | Payer: Medicaid Other | Source: Ambulatory Visit | Attending: General Surgery | Admitting: General Surgery

## 2023-09-12 ENCOUNTER — Other Ambulatory Visit: Payer: Self-pay

## 2023-09-12 ENCOUNTER — Encounter (HOSPITAL_COMMUNITY): Payer: Self-pay

## 2023-09-12 VITALS — BP 139/97 | HR 69 | Temp 98.7°F | Resp 20 | Ht 68.0 in | Wt 210.0 lb

## 2023-09-12 DIAGNOSIS — Z01818 Encounter for other preprocedural examination: Secondary | ICD-10-CM | POA: Diagnosis present

## 2023-09-12 DIAGNOSIS — I10 Essential (primary) hypertension: Secondary | ICD-10-CM | POA: Diagnosis not present

## 2023-09-12 HISTORY — DX: Unspecified osteoarthritis, unspecified site: M19.90

## 2023-09-12 HISTORY — DX: Gastro-esophageal reflux disease without esophagitis: K21.9

## 2023-09-12 HISTORY — DX: Chronic obstructive pulmonary disease, unspecified: J44.9

## 2023-09-12 HISTORY — DX: Anxiety disorder, unspecified: F41.9

## 2023-09-12 LAB — CBC
HCT: 47.1 % (ref 39.0–52.0)
Hemoglobin: 15.4 g/dL (ref 13.0–17.0)
MCH: 28.9 pg (ref 26.0–34.0)
MCHC: 32.7 g/dL (ref 30.0–36.0)
MCV: 88.5 fL (ref 80.0–100.0)
Platelets: 295 10*3/uL (ref 150–400)
RBC: 5.32 MIL/uL (ref 4.22–5.81)
RDW: 13.2 % (ref 11.5–15.5)
WBC: 5.1 10*3/uL (ref 4.0–10.5)
nRBC: 0 % (ref 0.0–0.2)

## 2023-09-12 LAB — BASIC METABOLIC PANEL
Anion gap: 9 (ref 5–15)
BUN: 11 mg/dL (ref 8–23)
CO2: 25 mmol/L (ref 22–32)
Calcium: 9.1 mg/dL (ref 8.9–10.3)
Chloride: 106 mmol/L (ref 98–111)
Creatinine, Ser: 0.98 mg/dL (ref 0.61–1.24)
GFR, Estimated: >60 mL/min (ref >60)
Glucose, Bld: 91 mg/dL (ref 70–99)
Potassium: 4.1 mmol/L (ref 3.5–5.1)
Sodium: 140 mmol/L (ref 135–145)

## 2023-09-17 ENCOUNTER — Ambulatory Visit: Payer: Self-pay | Admitting: General Surgery

## 2023-09-17 ENCOUNTER — Encounter (HOSPITAL_COMMUNITY): Payer: Self-pay | Admitting: General Surgery

## 2023-09-17 NOTE — H&P (View-Only) (Signed)
Chief Complaint: New Consultation   History of Present Illness: Lawrence Fitzgerald is a 63 y.o. male who is seen today as an office consultation at the request of Dr. Seymour Bars for evaluation of New Consultation .  Patient is a 63 year old male, who comes in with a history of hypertension, and a left inguinal bulge. He states this been there for approximately a month. He states that he does have some discomfort to the area. He is able to reduce it with some pressure. He had no signs or symptoms of incarceration or strangulation.  He had no previous abdominal surgery.  Review of Systems: A complete review of systems was obtained from the patient. I have reviewed this information and discussed as appropriate with the patient. See HPI as well for other ROS.  Review of Systems  Constitutional: Negative for fever.  HENT: Negative for congestion.  Eyes: Negative for blurred vision.  Respiratory: Negative for cough, shortness of breath and wheezing.  Cardiovascular: Negative for chest pain and palpitations.  Gastrointestinal: Negative for heartburn.  Genitourinary: Negative for dysuria.  Musculoskeletal: Negative for myalgias.  Skin: Negative for rash.  Neurological: Negative for dizziness and headaches.  Psychiatric/Behavioral: Negative for depression and suicidal ideas.  All other systems reviewed and are negative.   Medical History: Past Medical History:  Diagnosis Date  Anxiety  COPD (chronic obstructive pulmonary disease) (CMS/HHS-HCC)  GERD (gastroesophageal reflux disease)  Hypertension   There is no problem list on file for this patient.  History reviewed. No pertinent surgical history.   No Known Allergies  Current Outpatient Medications on File Prior to Visit  Medication Sig Dispense Refill  amLODIPine (NORVASC) 10 MG tablet Take 10 mg by mouth  atenoloL (TENORMIN) 50 MG tablet Take 50 mg by mouth 2 (two) times daily  lisinopriL (ZESTRIL) 10 MG tablet  LORazepam (ATIVAN) 1  MG tablet   No current facility-administered medications on file prior to visit.   Family History  Problem Relation Age of Onset  Skin cancer Mother  High blood pressure (Hypertension) Mother  Coronary Artery Disease (Blocked arteries around heart) Mother    Social History   Tobacco Use  Smoking Status Every Day  Types: Cigarettes  Start date: 25  Smokeless Tobacco Never    Social History   Socioeconomic History  Marital status: Single  Tobacco Use  Smoking status: Every Day  Types: Cigarettes  Start date: 1990  Smokeless tobacco: Never  Substance and Sexual Activity  Alcohol use: Never  Drug use: Never   Objective:   Vitals:  08/15/23 1323  BP: 138/80  Pulse: 77  Temp: 36.7 C (98.1 F)  SpO2: 99%  Weight: 97.5 kg (215 lb)  Height: 172.7 cm (5\' 8" )  PainSc: 3  PainLoc: Groin   Body mass index is 32.69 kg/m. Physical Exam Constitutional:  Appearance: Normal appearance.  HENT:  Head: Normocephalic and atraumatic.  Nose: Nose normal. No congestion.  Mouth/Throat:  Mouth: Mucous membranes are moist.  Pharynx: Oropharynx is clear.  Eyes:  Pupils: Pupils are equal, round, and reactive to light.  Cardiovascular:  Rate and Rhythm: Normal rate and regular rhythm.  Pulses: Normal pulses.  Heart sounds: Normal heart sounds. No murmur heard. No friction rub. No gallop.  Pulmonary:  Effort: Pulmonary effort is normal. No respiratory distress.  Breath sounds: Normal breath sounds. No stridor. No wheezing, rhonchi or rales.  Abdominal:  General: Abdomen is flat.  Hernia: A hernia is present. Hernia is present in the left inguinal area. There is  no hernia in the right inguinal area.  Musculoskeletal:  General: Normal range of motion.  Cervical back: Normal range of motion.  Skin: General: Skin is warm and dry.  Neurological:  General: No focal deficit present.  Mental Status: He is alert and oriented to person, place, and time.  Psychiatric:  Mood and  Affect: Mood normal.  Thought Content: Thought content normal.     Assessment and Plan:  Diagnoses and all orders for this visit:  Unilateral inguinal hernia without obstruction or gangrene, recurrence not specified   Lawrence Fitzgerald is a 63 y.o. male   We will proceed to the OR for a laparoscopic left inguinal hernia repair with mesh. All risks and benefits were discussed with the patient, to generally include infection, bleeding, damage to surrounding structures, acute and chronic nerve pain, and recurrence. Alternatives were offered and described. All questions were answered and the patient voiced understanding of the procedure and wishes to proceed at this point.  No follow-ups on file.  Axel Filler, MD, [facility], [facility]

## 2023-09-17 NOTE — Anesthesia Preprocedure Evaluation (Signed)
Anesthesia Evaluation  Patient identified by MRN, date of birth, ID band Patient awake    Reviewed: Allergy & Precautions, NPO status , Patient's Chart, lab work & pertinent test results, reviewed documented beta blocker date and time   Airway Mallampati: II  TM Distance: >3 FB     Dental  (+) Dental Advisory Given, Partial Lower, Missing,    Pulmonary COPD   Pulmonary exam normal breath sounds clear to auscultation       Cardiovascular hypertension, Pt. on medications and Pt. on home beta blockers Normal cardiovascular exam Rhythm:Regular Rate:Normal     Neuro/Psych  PSYCHIATRIC DISORDERS Anxiety Depression    negative neurological ROS     GI/Hepatic Neg liver ROS,GERD  ,,  Endo/Other  Obesity HLD  Renal/GU negative Renal ROS  negative genitourinary   Musculoskeletal  (+) Arthritis , Osteoarthritis,  Left Inguinal hernia   Abdominal  (+) + obese  Peds  Hematology negative hematology ROS (+)   Anesthesia Other Findings   Reproductive/Obstetrics ED                             Anesthesia Physical Anesthesia Plan  ASA: 2  Anesthesia Plan: General   Post-op Pain Management: Dilaudid IV, Precedex, Tylenol PO (pre-op)* and Lidocaine infusion*   Induction: Intravenous  PONV Risk Score and Plan: 4 or greater and Treatment may vary due to age or medical condition, Ondansetron, Dexamethasone and Midazolam  Airway Management Planned: Oral ETT  Additional Equipment: None  Intra-op Plan:   Post-operative Plan: Extubation in OR  Informed Consent: I have reviewed the patients History and Physical, chart, labs and discussed the procedure including the risks, benefits and alternatives for the proposed anesthesia with the patient or authorized representative who has indicated his/her understanding and acceptance.     Dental advisory given  Plan Discussed with:  Anesthesiologist  Anesthesia Plan Comments:         Anesthesia Quick Evaluation

## 2023-09-17 NOTE — H&P (Signed)
Chief Complaint: New Consultation   History of Present Illness: Lawrence Fitzgerald is a 63 y.o. male who is seen today as an office consultation at the request of Dr. Seymour Bars for evaluation of New Consultation .  Patient is a 63 year old male, who comes in with a history of hypertension, and a left inguinal bulge. He states this been there for approximately a month. He states that he does have some discomfort to the area. He is able to reduce it with some pressure. He had no signs or symptoms of incarceration or strangulation.  He had no previous abdominal surgery.  Review of Systems: A complete review of systems was obtained from the patient. I have reviewed this information and discussed as appropriate with the patient. See HPI as well for other ROS.  Review of Systems  Constitutional: Negative for fever.  HENT: Negative for congestion.  Eyes: Negative for blurred vision.  Respiratory: Negative for cough, shortness of breath and wheezing.  Cardiovascular: Negative for chest pain and palpitations.  Gastrointestinal: Negative for heartburn.  Genitourinary: Negative for dysuria.  Musculoskeletal: Negative for myalgias.  Skin: Negative for rash.  Neurological: Negative for dizziness and headaches.  Psychiatric/Behavioral: Negative for depression and suicidal ideas.  All other systems reviewed and are negative.   Medical History: Past Medical History:  Diagnosis Date  Anxiety  COPD (chronic obstructive pulmonary disease) (CMS/HHS-HCC)  GERD (gastroesophageal reflux disease)  Hypertension   There is no problem list on file for this patient.  History reviewed. No pertinent surgical history.   No Known Allergies  Current Outpatient Medications on File Prior to Visit  Medication Sig Dispense Refill  amLODIPine (NORVASC) 10 MG tablet Take 10 mg by mouth  atenoloL (TENORMIN) 50 MG tablet Take 50 mg by mouth 2 (two) times daily  lisinopriL (ZESTRIL) 10 MG tablet  LORazepam (ATIVAN) 1  MG tablet   No current facility-administered medications on file prior to visit.   Family History  Problem Relation Age of Onset  Skin cancer Mother  High blood pressure (Hypertension) Mother  Coronary Artery Disease (Blocked arteries around heart) Mother    Social History   Tobacco Use  Smoking Status Every Day  Types: Cigarettes  Start date: 25  Smokeless Tobacco Never    Social History   Socioeconomic History  Marital status: Single  Tobacco Use  Smoking status: Every Day  Types: Cigarettes  Start date: 1990  Smokeless tobacco: Never  Substance and Sexual Activity  Alcohol use: Never  Drug use: Never   Objective:   Vitals:  08/15/23 1323  BP: 138/80  Pulse: 77  Temp: 36.7 C (98.1 F)  SpO2: 99%  Weight: 97.5 kg (215 lb)  Height: 172.7 cm (5\' 8" )  PainSc: 3  PainLoc: Groin   Body mass index is 32.69 kg/m. Physical Exam Constitutional:  Appearance: Normal appearance.  HENT:  Head: Normocephalic and atraumatic.  Nose: Nose normal. No congestion.  Mouth/Throat:  Mouth: Mucous membranes are moist.  Pharynx: Oropharynx is clear.  Eyes:  Pupils: Pupils are equal, round, and reactive to light.  Cardiovascular:  Rate and Rhythm: Normal rate and regular rhythm.  Pulses: Normal pulses.  Heart sounds: Normal heart sounds. No murmur heard. No friction rub. No gallop.  Pulmonary:  Effort: Pulmonary effort is normal. No respiratory distress.  Breath sounds: Normal breath sounds. No stridor. No wheezing, rhonchi or rales.  Abdominal:  General: Abdomen is flat.  Hernia: A hernia is present. Hernia is present in the left inguinal area. There is  no hernia in the right inguinal area.  Musculoskeletal:  General: Normal range of motion.  Cervical back: Normal range of motion.  Skin: General: Skin is warm and dry.  Neurological:  General: No focal deficit present.  Mental Status: He is alert and oriented to person, place, and time.  Psychiatric:  Mood and  Affect: Mood normal.  Thought Content: Thought content normal.     Assessment and Plan:  Diagnoses and all orders for this visit:  Unilateral inguinal hernia without obstruction or gangrene, recurrence not specified   Lawrence Fitzgerald is a 63 y.o. male   We will proceed to the OR for a laparoscopic left inguinal hernia repair with mesh. All risks and benefits were discussed with the patient, to generally include infection, bleeding, damage to surrounding structures, acute and chronic nerve pain, and recurrence. Alternatives were offered and described. All questions were answered and the patient voiced understanding of the procedure and wishes to proceed at this point.  No follow-ups on file.  Axel Filler, MD, [facility], [place] [facility]

## 2023-09-18 ENCOUNTER — Other Ambulatory Visit: Payer: Self-pay

## 2023-09-18 ENCOUNTER — Encounter (HOSPITAL_COMMUNITY): Payer: Self-pay | Admitting: General Surgery

## 2023-09-18 ENCOUNTER — Ambulatory Visit (HOSPITAL_COMMUNITY)
Admission: RE | Admit: 2023-09-18 | Discharge: 2023-09-18 | Disposition: A | Discharge status: Home / Self Care | Payer: Medicaid Other | Attending: General Surgery | Admitting: General Surgery

## 2023-09-18 ENCOUNTER — Ambulatory Visit (HOSPITAL_BASED_OUTPATIENT_CLINIC_OR_DEPARTMENT_OTHER): Payer: Self-pay | Admitting: Anesthesiology

## 2023-09-18 ENCOUNTER — Encounter (HOSPITAL_COMMUNITY)
Admission: RE | Disposition: A | Discharge status: Home / Self Care | Payer: Self-pay | Source: Home / Self Care | Attending: General Surgery

## 2023-09-18 ENCOUNTER — Ambulatory Visit (HOSPITAL_COMMUNITY): Payer: Self-pay | Admitting: Anesthesiology

## 2023-09-18 DIAGNOSIS — E785 Hyperlipidemia, unspecified: Secondary | ICD-10-CM | POA: Insufficient documentation

## 2023-09-18 DIAGNOSIS — F17210 Nicotine dependence, cigarettes, uncomplicated: Secondary | ICD-10-CM | POA: Diagnosis not present

## 2023-09-18 DIAGNOSIS — I10 Essential (primary) hypertension: Secondary | ICD-10-CM | POA: Insufficient documentation

## 2023-09-18 DIAGNOSIS — J449 Chronic obstructive pulmonary disease, unspecified: Secondary | ICD-10-CM | POA: Insufficient documentation

## 2023-09-18 DIAGNOSIS — K4090 Unilateral inguinal hernia, without obstruction or gangrene, not specified as recurrent: Secondary | ICD-10-CM | POA: Diagnosis present

## 2023-09-18 HISTORY — PX: INGUINAL HERNIA REPAIR: SHX194

## 2023-09-18 SURGERY — REPAIR, HERNIA, INGUINAL, LAPAROSCOPIC
Anesthesia: General | Laterality: Left

## 2023-09-18 MED ORDER — ACETAMINOPHEN 500 MG PO TABS
1000.0000 mg | ORAL_TABLET | ORAL | Status: AC
  Administered 2023-09-18: 1000 mg via ORAL
  Filled 2023-09-18: qty 2

## 2023-09-18 MED ORDER — TRAMADOL HCL 50 MG PO TABS: 50.0000 mg | ORAL_TABLET | Freq: Four times a day (QID) | ORAL | 0 refills | Status: AC | PRN

## 2023-09-18 MED ORDER — PROPOFOL 10 MG/ML IV BOLUS
INTRAVENOUS | Status: AC
  Filled 2023-09-18: qty 20

## 2023-09-18 MED ORDER — ROCURONIUM BROMIDE 10 MG/ML (PF) SYRINGE
PREFILLED_SYRINGE | INTRAVENOUS | Status: DC | PRN
  Administered 2023-09-18: 70 mg via INTRAVENOUS

## 2023-09-18 MED ORDER — HYDROMORPHONE HCL 1 MG/ML IJ SOLN
0.2500 mg | INTRAMUSCULAR | Status: DC | PRN
Start: 2023-09-18 — End: 2023-09-18
  Administered 2023-09-18: 0.5 mg via INTRAVENOUS

## 2023-09-18 MED ORDER — ONDANSETRON HCL 4 MG/2ML IJ SOLN
INTRAMUSCULAR | Status: DC | PRN
  Administered 2023-09-18: 4 mg via INTRAVENOUS

## 2023-09-18 MED ORDER — HYDROMORPHONE HCL 1 MG/ML IJ SOLN
INTRAMUSCULAR | Status: AC
  Administered 2023-09-18: 0.5 mg via INTRAVENOUS
  Filled 2023-09-18: qty 1

## 2023-09-18 MED ORDER — FENTANYL CITRATE (PF) 100 MCG/2ML IJ SOLN
INTRAMUSCULAR | Status: AC
  Filled 2023-09-18: qty 2

## 2023-09-18 MED ORDER — FENTANYL CITRATE (PF) 100 MCG/2ML IJ SOLN
INTRAMUSCULAR | Status: DC | PRN
  Administered 2023-09-18: 100 ug via INTRAVENOUS

## 2023-09-18 MED ORDER — LIDOCAINE HCL (PF) 2 % IJ SOLN
INTRAMUSCULAR | Status: AC
  Filled 2023-09-18: qty 5

## 2023-09-18 MED ORDER — DEXAMETHASONE SODIUM PHOSPHATE 10 MG/ML IJ SOLN
INTRAMUSCULAR | Status: DC | PRN
  Administered 2023-09-18: 4 mg via INTRAVENOUS

## 2023-09-18 MED ORDER — 0.9 % SODIUM CHLORIDE (POUR BTL) OPTIME
TOPICAL | Status: DC | PRN
  Administered 2023-09-18: 1000 mL

## 2023-09-18 MED ORDER — MIDAZOLAM HCL 2 MG/2ML IJ SOLN
INTRAMUSCULAR | Status: DC | PRN
  Administered 2023-09-18: 2 mg via INTRAVENOUS

## 2023-09-18 MED ORDER — CEFAZOLIN SODIUM-DEXTROSE 2-4 GM/100ML-% IV SOLN
2.0000 g | INTRAVENOUS | Status: AC
  Administered 2023-09-18: 2 g via INTRAVENOUS
  Filled 2023-09-18: qty 100

## 2023-09-18 MED ORDER — CHLORHEXIDINE GLUCONATE CLOTH 2 % EX PADS: 6.0000 | MEDICATED_PAD | Freq: Once | CUTANEOUS | Status: DC

## 2023-09-18 MED ORDER — ROCURONIUM BROMIDE 10 MG/ML (PF) SYRINGE
PREFILLED_SYRINGE | INTRAVENOUS | Status: AC
  Filled 2023-09-18: qty 10

## 2023-09-18 MED ORDER — LACTATED RINGERS IV SOLN: INTRAVENOUS | Status: DC

## 2023-09-18 MED ORDER — ENSURE PRE-SURGERY PO LIQD
296.0000 mL | Freq: Once | ORAL | Status: DC
  Filled 2023-09-18: qty 296

## 2023-09-18 MED ORDER — PROPOFOL 10 MG/ML IV BOLUS
INTRAVENOUS | Status: DC | PRN
  Administered 2023-09-18: 170 mg via INTRAVENOUS

## 2023-09-18 MED ORDER — ONDANSETRON HCL 4 MG/2ML IJ SOLN
INTRAMUSCULAR | Status: AC
Start: 2023-09-18 — End: ?
  Filled 2023-09-18: qty 2

## 2023-09-18 MED ORDER — CHLORHEXIDINE GLUCONATE 0.12 % MT SOLN
15.0000 mL | Freq: Once | OROMUCOSAL | Status: AC
  Administered 2023-09-18: 15 mL via OROMUCOSAL

## 2023-09-18 MED ORDER — OXYCODONE HCL 5 MG PO TABS
ORAL_TABLET | ORAL | Status: AC
  Administered 2023-09-18: 5 mg via ORAL
  Filled 2023-09-18: qty 1

## 2023-09-18 MED ORDER — LIDOCAINE 2% (20 MG/ML) 5 ML SYRINGE
INTRAMUSCULAR | Status: DC | PRN
  Administered 2023-09-18: 60 mg via INTRAVENOUS

## 2023-09-18 MED ORDER — ONDANSETRON HCL 4 MG/2ML IJ SOLN: 4.0000 mg | Freq: Once | INTRAMUSCULAR | Status: DC | PRN

## 2023-09-18 MED ORDER — OXYCODONE HCL 5 MG/5ML PO SOLN: 5.0000 mg | Freq: Once | ORAL | Status: AC | PRN

## 2023-09-18 MED ORDER — BUPIVACAINE-EPINEPHRINE 0.25% -1:200000 IJ SOLN
INTRAMUSCULAR | Status: AC
  Filled 2023-09-18: qty 1

## 2023-09-18 MED ORDER — ORAL CARE MOUTH RINSE: 15.0000 mL | Freq: Once | OROMUCOSAL | Status: AC

## 2023-09-18 MED ORDER — STERILE WATER FOR IRRIGATION IR SOLN
Status: DC | PRN
  Administered 2023-09-18: 500 mL

## 2023-09-18 MED ORDER — DROPERIDOL 2.5 MG/ML IJ SOLN: 0.6250 mg | Freq: Once | INTRAMUSCULAR | Status: DC | PRN

## 2023-09-18 MED ORDER — OXYCODONE HCL 5 MG PO TABS: 5.0000 mg | ORAL_TABLET | Freq: Once | ORAL | Status: AC | PRN

## 2023-09-18 MED ORDER — SUGAMMADEX SODIUM 200 MG/2ML IV SOLN
INTRAVENOUS | Status: DC | PRN
  Administered 2023-09-18: 200 mg via INTRAVENOUS

## 2023-09-18 MED ORDER — MIDAZOLAM HCL 2 MG/2ML IJ SOLN
INTRAMUSCULAR | Status: AC
  Filled 2023-09-18: qty 2

## 2023-09-18 MED ORDER — DEXAMETHASONE SODIUM PHOSPHATE 10 MG/ML IJ SOLN
INTRAMUSCULAR | Status: AC
  Filled 2023-09-18: qty 1

## 2023-09-18 MED ORDER — BUPIVACAINE-EPINEPHRINE 0.25% -1:200000 IJ SOLN
INTRAMUSCULAR | Status: DC | PRN
  Administered 2023-09-18: 6 mL

## 2023-09-18 SURGICAL SUPPLY — 34 items
APPLIER CLIP 5 13 M/L LIGAMAX5 (MISCELLANEOUS) IMPLANT
CABLE HIGH FREQUENCY MONO STRZ (ELECTRODE) ×1 IMPLANT
CHLORAPREP W/TINT 26 (MISCELLANEOUS) ×1 IMPLANT
CLIP APPLIE 5 13 M/L LIGAMAX5 (MISCELLANEOUS) IMPLANT
DERMABOND ADVANCED .7 DNX12 (GAUZE/BANDAGES/DRESSINGS) ×1 IMPLANT
DISSECTOR BLUNT TIP ENDO 5MM (MISCELLANEOUS) IMPLANT
ELECT REM PT RETURN 15FT ADLT (MISCELLANEOUS) ×1 IMPLANT
ENDOLOOP SUT PDS II 0 18 (SUTURE) IMPLANT
GLOVE BIO SURGEON STRL SZ7.5 (GLOVE) ×1 IMPLANT
GOWN STRL REUS W/ TWL XL LVL3 (GOWN DISPOSABLE) ×2 IMPLANT
IRRIG SUCT STRYKERFLOW 2 WTIP (MISCELLANEOUS) IMPLANT
IRRIGATION SUCT STRKRFLW 2 WTP (MISCELLANEOUS) IMPLANT
KIT BASIN OR (CUSTOM PROCEDURE TRAY) ×1 IMPLANT
KIT TURNOVER KIT A (KITS) IMPLANT
MARKER SKIN DUAL TIP RULER LAB (MISCELLANEOUS) ×1 IMPLANT
MESH 3DMAX 5X7 LT XLRG (Mesh General) ×1 IMPLANT
NDL INSUFFLATION 14GA 120MM (NEEDLE) IMPLANT
NEEDLE INSUFFLATION 14GA 120MM (NEEDLE) IMPLANT
RELOAD STAPLE 4.0 BLU F/HERNIA (INSTRUMENTS) ×1 IMPLANT
RELOAD STAPLE 4.8 BLK F/HERNIA (STAPLE) IMPLANT
RELOAD STAPLE HERNIA 4.0 BLUE (INSTRUMENTS) ×1 IMPLANT
RELOAD STAPLE HERNIA 4.8 BLK (STAPLE) IMPLANT
SCISSORS LAP 5X35 DISP (ENDOMECHANICALS) IMPLANT
SET TUBE SMOKE EVAC HIGH FLOW (TUBING) ×1 IMPLANT
SPIKE FLUID TRANSFER (MISCELLANEOUS) ×1 IMPLANT
STAPLER HERNIA 12 8.5 360D (INSTRUMENTS) ×1 IMPLANT
SUT MNCRL AB 4-0 PS2 18 (SUTURE) ×1 IMPLANT
SUT VIC AB 1 CT1 27XBRD ANBCTR (SUTURE) IMPLANT
SUT VICRYL 0 UR6 27IN ABS (SUTURE) IMPLANT
TOWEL OR 17X26 10 PK STRL BLUE (TOWEL DISPOSABLE) ×1 IMPLANT
TRAY LAPAROSCOPIC (CUSTOM PROCEDURE TRAY) ×1 IMPLANT
TROCAR OPTICAL SHORT 5MM (TROCAR) ×1 IMPLANT
TROCAR OPTICAL SLV SHORT 5MM (TROCAR) ×1 IMPLANT
TROCAR Z THREAD OPTICAL 12X100 (TROCAR) ×1 IMPLANT

## 2023-09-18 NOTE — Interval H&P Note (Signed)
History and Physical Interval Note:  09/18/2023 7:11 AM  Lawrence Fitzgerald  has presented today for surgery, with the diagnosis of Left Inguinal Hernia with Mesh.  The various methods of treatment have been discussed with the patient and family. After consideration of risks, benefits and other options for treatment, the patient has consented to  Procedure(s): LAPAROSCOPIC LEFT INGUINAL HERNIA REPAIR WITH MESH (Left) as a surgical intervention.  The patient's history has been reviewed, patient examined, no change in status, stable for surgery.  I have reviewed the patient's chart and labs.  Questions were answered to the patient's satisfaction.     Axel Filler

## 2023-09-18 NOTE — Transfer of Care (Signed)
Immediate Anesthesia Transfer of Care Note  Patient: Lawrence Fitzgerald  Procedure(s) Performed: LAPAROSCOPIC LEFT INGUINAL HERNIA REPAIR WITH MESH (Left)  Patient Location: PACU  Anesthesia Type:General  Level of Consciousness: drowsy  Airway & Oxygen Therapy: Patient Spontanous Breathing and Patient connected to face mask oxygen  Post-op Assessment: Report given to RN, Post -op Vital signs reviewed and stable, and Patient moving all extremities X 4  Post vital signs: Reviewed and stable  Last Vitals:  Vitals Value Taken Time  BP 122/83   Temp    Pulse 60 09/18/23 0801  Resp 12   SpO2 98 % 09/18/23 0801  Vitals shown include unfiled device data.  Last Pain:  Vitals:   09/18/23 0619  TempSrc:   PainSc: 0-No pain         Complications: No notable events documented.

## 2023-09-18 NOTE — Anesthesia Procedure Notes (Signed)
Procedure Name: Intubation Date/Time: 09/18/2023 7:25 AM  Performed by: Nelle Don, CRNAPre-anesthesia Checklist: Patient identified, Emergency Drugs available, Suction available and Patient being monitored Patient Re-evaluated:Patient Re-evaluated prior to induction Oxygen Delivery Method: Circle system utilized Preoxygenation: Pre-oxygenation with 100% oxygen Induction Type: IV induction Ventilation: Mask ventilation without difficulty and Oral airway inserted - appropriate to patient size Laryngoscope Size: Mac and 4 Grade View: Grade II Tube type: Oral Tube size: 7.0 mm Number of attempts: 1 Airway Equipment and Method: Stylet Placement Confirmation: ETT inserted through vocal cords under direct vision, positive ETCO2 and breath sounds checked- equal and bilateral Secured at: 22 cm Tube secured with: Tape Dental Injury: Teeth and Oropharynx as per pre-operative assessment

## 2023-09-18 NOTE — Discharge Instructions (Signed)

## 2023-09-18 NOTE — Anesthesia Postprocedure Evaluation (Signed)
Anesthesia Post Note  Patient: Lawrence Fitzgerald  Procedure(s) Performed: LAPAROSCOPIC LEFT INGUINAL HERNIA REPAIR WITH MESH (Left)     Patient location during evaluation: PACU Anesthesia Type: General Level of consciousness: awake and alert and oriented Pain management: pain level controlled Vital Signs Assessment: post-procedure vital signs reviewed and stable Respiratory status: spontaneous breathing, nonlabored ventilation and respiratory function stable Cardiovascular status: blood pressure returned to baseline and stable Postop Assessment: no apparent nausea or vomiting Anesthetic complications: no   No notable events documented.  Last Vitals:  Vitals:   09/18/23 0815 09/18/23 0830  BP: (!) 125/92 (!) 132/102  Pulse: 63 67  Resp: 11 17  Temp:    SpO2: 98% 100%    Last Pain:  Vitals:   09/18/23 0830  TempSrc:   PainSc: 6                  Talayeh Bruinsma A.

## 2023-09-18 NOTE — Op Note (Signed)
09/18/2023  7:52 AM  PATIENT:  Lawrence Fitzgerald  63 y.o. male  PRE-OPERATIVE DIAGNOSIS:  Left Inguinal Hernia with Mesh  POST-OPERATIVE DIAGNOSIS:  Left Indirect Inguinal Hernia with Mesh  PROCEDURE:  Procedure(s): LAPAROSCOPIC LEFT INGUINAL HERNIA REPAIR WITH MESH (Left)  SURGEON:  Surgeons and Role:    Axel Filler, MD - Primary  ANESTHESIA:   local and general  EBL:  minimal   BLOOD ADMINISTERED:none  DRAINS: none   LOCAL MEDICATIONS USED:  BUPIVICAINE   SPECIMEN:  No Specimen  DISPOSITION OF SPECIMEN:  N/A  COUNTS:  YES  TOURNIQUET:  * No tourniquets in log *  DICTATION: .Dragon Dictation  Counts: reported as correct x 2   Findings:  The patient had a moderate sized left indirect hernia and a moderated sized cord lipoma   Indications for procedure:  The patient is a 63 year old male with a left inguinal hernia for several months. Patient complained of symptomatology to the left inguinal area. The patient was taken back for elective inguinal hernia repair.   Details of the procedure: The patient was taken back to the operating room. The patient was placed in supine position with bilateral SCDs in place.  The patient was prepped and draped in the usual sterile fashion.  After appropriate anitbiotics were confirmed, a time-out was confirmed and all facts were verified.   0.25% Marcaine was used to infiltrate the umbilical area. A 11-blade was used to cut down the skin and blunt dissection was used to get the anterior fashion.  The anterior fascia was incised approximately 1 cm and the muscles were retracted laterally. Blunt dissection was then used to create a space in the preperitoneal area. At this time a 10 mm camera was then introduced into the space and advanced the pubic tubercle and a 12 mm trocar was placed over this and insufflation was started.  At this time and space was created from medial to laterally the preperitoneal space.  Cooper's ligament was  initially cleaned off.  The hernia sac was identified in the indirect space. Dissection of the hernia sac and cord structures was undertaken the vas deferens was identified and protected in all parts of the case.    Once the hernia sac was taken down to approximately the umbilicus a Left Bard 3D Max mesh, size: Barney Drain, was  introduced into the preperitoneal space.  The mesh was brought over to cover the direct and indirect hernia spaces.  This was anchored into place and secured to Cooper's ligament with 4.61mm staples from a Cov[place] hernia stapler. It was anchored to the anterior abdominal wall with 4.8 mm staples. The hernia sac was seen lying posterior to the mesh. There was no staples placed laterally. The insufflation was evacuated and the peritoneum was seen posterior to the mesh. The trochars were removed. The anterior fascia was reapproximated using #1 Vicryl on a UR- 6. The skin was reapproximated using 4-0 Monocryl subcuticular fashion and Dermabond. The patient was awakened from general anesthesia and taken to recovery in stable condition.   PLAN OF CARE: Discharge to home after PACU  PATIENT DISPOSITION:  PACU - hemodynamically stable.   Delay start of Pharmacological VTE agent (>24hrs) due to surgical blood loss or risk of bleeding: not applicable

## 2023-09-19 ENCOUNTER — Encounter (HOSPITAL_COMMUNITY): Payer: Self-pay | Admitting: General Surgery
